# Patient Record
Sex: Male | Born: 1935 | Race: White | Hispanic: No | Marital: Married | Smoking: Former smoker
Health system: Southern US, Community
[De-identification: ages and names within clinical notes are randomized; demographics above are authoritative.]

## PROBLEM LIST (undated history)

## (undated) DIAGNOSIS — I4891 Unspecified atrial fibrillation: Secondary | ICD-10-CM

## (undated) DIAGNOSIS — I10 Essential (primary) hypertension: Secondary | ICD-10-CM

## (undated) DIAGNOSIS — M109 Gout, unspecified: Secondary | ICD-10-CM

## (undated) HISTORY — PX: TOTAL SHOULDER ARTHROPLASTY: SHX126

## (undated) HISTORY — DX: Essential (primary) hypertension: I10

## (undated) HISTORY — DX: Gout, unspecified: M10.9

## (undated) HISTORY — PX: APPENDECTOMY: SHX54

## (undated) HISTORY — DX: Unspecified atrial fibrillation: I48.91

---

## 2015-05-29 ENCOUNTER — Other Ambulatory Visit (HOSPITAL_COMMUNITY): Payer: Self-pay | Admitting: Orthopedic Surgery

## 2015-05-29 DIAGNOSIS — M25511 Pain in right shoulder: Secondary | ICD-10-CM

## 2015-05-31 ENCOUNTER — Other Ambulatory Visit: Payer: Self-pay | Admitting: Orthopedic Surgery

## 2015-05-31 DIAGNOSIS — M25511 Pain in right shoulder: Secondary | ICD-10-CM

## 2015-06-02 ENCOUNTER — Ambulatory Visit
Admission: RE | Admit: 2015-06-02 | Discharge: 2015-06-02 | Disposition: A | Discharge status: Home / Self Care | Payer: Medicare Other | Source: Ambulatory Visit | Attending: Orthopedic Surgery | Admitting: Orthopedic Surgery

## 2015-06-02 ENCOUNTER — Other Ambulatory Visit: Payer: Self-pay

## 2015-06-02 DIAGNOSIS — M25511 Pain in right shoulder: Secondary | ICD-10-CM

## 2016-08-23 NOTE — Progress Notes (Signed)
Office Visit Note  Patient: Billy Hurley             Date of Birth: 09-16-1935           MRN: [id number]             PCP: Olena Mater, MD Referring: Olena Mater, MD Visit Date: 08/26/2016 Occupation: Retired, Dance movement psychotherapist    Subjective:  Knee pain   History of Present Illness: Billy Hurley is a 81 y.o. male seen in consultation per request of his PCP. According to patient in July 2017 he noticed that he was having difficulty getting out of a low chair. The symptoms persisted until September 2017 when he went to see his PCP where he had x-rays and the x-ray showed only mild osteoarthritis in his knee joints. He was given a cortisone injection and has helped him which lasted for about 3 weeks. He went to follow-up with Dr. Lorre Nick were done surgery on her shoulder in the past. He states in December 2017 Dr. Lorre Nick gave him a cortisone injection to his right knee joint which lasted for short. He was having pain and discomfort in his bilateral knee joints at the time. He went for follow-up to see Dr. Lorre Nick in January at the time he did x-rays of his lumbar spine and and his knee joints. Dr. Lorre Nick did not advise any surgery. He recommended rheumatology evaluation. Patient recalls at this time he was having a stiffness in her shoulders and his hips and his knee joints is painful worse and he went to see his PCP. He was given prednisone Dosepak for 14 days which helped him a lot. Recalls that he could do most of his activities while on prednisone Dosepak. He was also given Voltaren gel topically to be used on his knees. After the prednisone dosepak finished his symptoms recurred. He is having difficulty with mobility inside the house. He has got a stair lift, a walker and a lift chair now. He was prescribed tramadol which does not help and causes constipation. He's been taking Tylenol 500 mg 2 tablets twice a day to relieve pain. He's been also sitting a lot due to discomfort. He is unable to lie on his  side as uses CPAP. He does not recall any joint swelling but he believes at one time he had some fluid on his left knee.   Activities of Daily Living:  Patient reports morning stiffness for all day hours.   Patient Reports nocturnal pain.  Difficulty dressing/grooming: Denies Difficulty climbing stairs: Reports Difficulty getting out of chair: Reports Difficulty using hands for taps, buttons, cutlery, and/or writing: Denies   Review of Systems  Constitutional: Negative for night sweats and weakness ( ).  HENT: Negative for mouth sores, mouth dryness and nose dryness.   Eyes: Negative for redness and dryness.  Respiratory: Negative for shortness of breath and difficulty breathing.   Cardiovascular: Negative for chest pain, palpitations, hypertension, irregular heartbeat and swelling in legs/feet.  Gastrointestinal: Negative for constipation and diarrhea.  Endocrine: Negative for increased urination.  Musculoskeletal: Positive for arthralgias, joint pain and morning stiffness. Negative for joint swelling, muscle weakness and muscle tenderness.  Skin: Negative for color change, rash, hair loss, nodules/bumps, skin tightness, ulcers and sensitivity to sunlight.  Allergic/Immunologic: Negative for susceptible to infections.  Neurological: Negative for dizziness, fainting, memory loss and night sweats.  Hematological: Negative for swollen glands.  Psychiatric/Behavioral: Positive for sleep disturbance. Negative for depressed mood. The patient is not nervous/anxious.  PMFS History:  Patient Active Problem List   Diagnosis Date Noted  . History of gout 08/26/2016  . Primary osteoarthritis of both knees 08/26/2016  . Impingement syndrome of shoulder region 08/26/2016  . Essential hypertension 08/26/2016  . History of hyperglycemia 08/26/2016  . Chronic atrial fibrillation ([place]) 08/26/2016  . Other sleep apnea 08/26/2016    Past Medical History:  Diagnosis Date  . A-fib ([place])   .  Gout   . Hypertension     Family History  Problem Relation Age of Onset  . Stroke Brother   . Hypertension Brother   . Hypertension Brother    Past Surgical History:  Procedure Laterality Date  . APPENDECTOMY    . TOTAL SHOULDER ARTHROPLASTY     Social History   Social History Narrative  . No narrative on file     Objective: Vital Signs: BP (!) 134/58 (BP Location: Left Arm, Patient Position: Sitting, Cuff Size: Normal)   Pulse 81   Resp 16   Ht '5\' 9"'  (1.753 m)   Wt 204 lb (92.5 kg)   BMI 30.13 kg/m    Physical Exam  Constitutional: He is oriented to person, place, and time. He appears well-developed and well-nourished.  HENT:  Head: Normocephalic and atraumatic.  Eyes: Conjunctivae and EOM are normal. Pupils are equal, round, and reactive to light.  Neck: Normal range of motion. Neck supple.  Cardiovascular: Normal rate, regular rhythm and normal heart sounds.   Pitting edema over bilateral lower extremities  Pulmonary/Chest: Effort normal and breath sounds normal.  Abdominal: Soft. Bowel sounds are normal.  Neurological: He is alert and oriented to person, place, and time.  Skin: Skin is warm and dry. Capillary refill takes less than 2 seconds.  Psychiatric: He has a normal mood and affect. His behavior is normal.  Nursing note and vitals reviewed.    Musculoskeletal Exam: C-spine limited range of motion, thoracic and lumbar spine good range of motion no SI joint tenderness. His right shoulder joint abduction was limited to 130 and left shoulder joint is limited to 90. He had no muscular tenderness around his shoulders. Elbow joints wrist joints are good range of motion he had DIP PIP thickening in bilateral hands consistent with osteoarthritis. He had tenderness over bilateral trochanteric bursa area hip joints with good range of motion. He had no muscular weakness in his bilateral lower extremities. He has warmth swelling and effusion in his bilateral knee joints.  He had pedal edema. No ankle joint warmth or swelling was noted. MTPs PIPs DIPs did not show any synovitis. CDAI Exam:  No CDAI exam completed.    Investigation: Findings:  07/09/2016 RF less than 14, ANA negative, uric acid 4.4, ESR 109, 06/24/2016 CMP alkaline phosphatase 145 elevated, CBC hemoglobin 11.4    Imaging: No results found.  Speciality Comments: No specialty comments available.    Procedures:  Large Joint Inj Date/Time: 08/26/2016 12:48 PM Performed by: Bo Merino Authorized by: Bo Merino   Consent Given by:  Patient Site marked: the procedure site was marked   Timeout: prior to procedure the correct patient, procedure, and site was verified   Indications:  Pain and joint swelling Location:  Knee Site:  L knee Prep: patient was prepped and draped in usual sterile fashion   Needle Size:  22 G Needle Length:  1.5 inches Approach:  Medial Ultrasound Guidance: No   Fluoroscopic Guidance: No   Arthrogram: No   Medications:  2 mL lidocaine 1 %; 60  mg triamcinolone acetonide 40 MG/ML Aspiration Attempted: Yes   Aspirate amount (mL):  7 Lab: fluid sent for laboratory analysis   Patient tolerance:  Patient tolerated the procedure well with no immediate complications    Allergies: Patient has no allergy information on record.   Assessment / Plan:     Visit Diagnoses: History of gout: Patient states that he has not had a flare of gout in a long time. And his uric acid done in December was within normal limits.  Inflammatory arthritis: He has bilateral knee joint effusion and elevated sedimentation rate. His rheumatoid factor was negative and uric acid was negative I will do further workup to evaluate this. In the meantime I'll put him on prednisone taper starting at 20 mg for one week, 15 mg for 2 weeks and then he will stay on 10 mg a day total follow-up. In the meantime he should have his lab results back.  Primary osteoarthritis of both knees -  X-rays 04/25/2016 mild OA, knee joint effusion, ESR 109. He has some severe pain and discomfort in his knee joints and difficulty with mobility.  Effusion of both knee joints: After informed consent was obtained he is left knee joint was aspirated and synovial surface sent for cell count crystals and culture. The procedure as described above.  Impingement syndrome of shoulder region, unspecified laterality - Right shoulder joint debridement November 2016 Dr. Lorre Nick  Essential hypertension  History of hyperglycemia  Chronic atrial fibrillation ([place])  Other sleep apnea - on CPAP    Orders: Orders Placed This Encounter  Procedures  . Large Joint Injection/Arthrocentesis  . Body fluid culture  . DG Chest 2 View  . Synovial cell count + diff, w/ crystals  . CBC with Differential/Platelet  . COMPLETE METABOLIC PANEL WITH GFR  . Urinalysis, Routine w reflex microscopic  . Cyclic citrul peptide antibody, IgG  . 14-3-3 eta Protein  . Serum protein electrophoresis with reflex  . IgG, IgA, IgM  . QuantiFERON(R)-TB Gold, Incubated  . HIV antibody  . Sedimentation rate  . HLA-B27 antigen  . Hepatitis B Surface AntiGEN  . Hepatitis B Core Antibody, IgM  . Hepatitis A antibody, IgM  . Hepatitis C Antibody  . Angiotensin converting enzyme   Meds ordered this encounter  Medications  . predniSONE (DELTASONE) 5 MG tablet    Sig: Take four tablets in am x1 week, then 3 tablets q am x2 weeks, then 2 tablets q am (continue this dose until rechecked)    Dispense:  120 tablet    Refill:  0    Face-to-face time spent with patient was 60 minutes. 50% of time was spent in counseling and coordination of care.  Follow-Up Instructions: Return for Inflammatory arthritis.   Bo Merino, MD  Note - This record has been created using Editor, commissioning.  Chart creation errors have been sought, but may not always  have been located. Such creation errors do not reflect on  the standard of  medical care.

## 2016-08-26 ENCOUNTER — Ambulatory Visit (INDEPENDENT_AMBULATORY_CARE_PROVIDER_SITE_OTHER): Payer: Medicare Other | Admitting: Rheumatology

## 2016-08-26 ENCOUNTER — Encounter: Payer: Self-pay | Admitting: Rheumatology

## 2016-08-26 ENCOUNTER — Ambulatory Visit (HOSPITAL_COMMUNITY)
Admission: RE | Admit: 2016-08-26 | Discharge: 2016-08-26 | Disposition: A | Discharge status: Home / Self Care | Payer: Medicare Other | Source: Ambulatory Visit | Attending: Rheumatology | Admitting: Rheumatology

## 2016-08-26 VITALS — BP 134/58 | HR 81 | Resp 16 | Ht 69.0 in | Wt 204.0 lb

## 2016-08-26 DIAGNOSIS — M25461 Effusion, right knee: Secondary | ICD-10-CM

## 2016-08-26 DIAGNOSIS — M25462 Effusion, left knee: Secondary | ICD-10-CM

## 2016-08-26 DIAGNOSIS — I482 Chronic atrial fibrillation, unspecified: Secondary | ICD-10-CM

## 2016-08-26 DIAGNOSIS — M170 Bilateral primary osteoarthritis of knee: Secondary | ICD-10-CM

## 2016-08-26 DIAGNOSIS — Z114 Encounter for screening for human immunodeficiency virus [HIV]: Secondary | ICD-10-CM

## 2016-08-26 DIAGNOSIS — Z111 Encounter for screening for respiratory tuberculosis: Secondary | ICD-10-CM

## 2016-08-26 DIAGNOSIS — Z1159 Encounter for screening for other viral diseases: Secondary | ICD-10-CM

## 2016-08-26 DIAGNOSIS — M7540 Impingement syndrome of unspecified shoulder: Secondary | ICD-10-CM | POA: Diagnosis not present

## 2016-08-26 DIAGNOSIS — M2550 Pain in unspecified joint: Secondary | ICD-10-CM

## 2016-08-26 DIAGNOSIS — I517 Cardiomegaly: Secondary | ICD-10-CM | POA: Insufficient documentation

## 2016-08-26 DIAGNOSIS — J90 Pleural effusion, not elsewhere classified: Secondary | ICD-10-CM | POA: Diagnosis not present

## 2016-08-26 DIAGNOSIS — Z8639 Personal history of other endocrine, nutritional and metabolic disease: Secondary | ICD-10-CM | POA: Diagnosis not present

## 2016-08-26 DIAGNOSIS — I10 Essential (primary) hypertension: Secondary | ICD-10-CM | POA: Diagnosis not present

## 2016-08-26 DIAGNOSIS — G4739 Other sleep apnea: Secondary | ICD-10-CM

## 2016-08-26 DIAGNOSIS — Z8739 Personal history of other diseases of the musculoskeletal system and connective tissue: Secondary | ICD-10-CM

## 2016-08-26 LAB — COMPLETE METABOLIC PANEL WITH GFR
ALT: 13 U/L (ref 9–46)
AST: 18 U/L (ref 10–35)
Albumin: 3.6 g/dL (ref 3.6–5.1)
Alkaline Phosphatase: 148 U/L — ABNORMAL HIGH (ref 40–115)
BUN: 14 mg/dL (ref 7–25)
CALCIUM: 9.1 mg/dL (ref 8.6–10.3)
CHLORIDE: 98 mmol/L (ref 98–110)
CO2: 22 mmol/L (ref 20–31)
Creat: 0.56 mg/dL — ABNORMAL LOW (ref 0.70–1.18)
GFR, Est Non African American: >89 mL/min (ref >=60)
Glucose, Bld: 91 mg/dL (ref 65–99)
Potassium: 4.3 mmol/L (ref 3.5–5.3)
Sodium: 133 mmol/L — ABNORMAL LOW (ref 135–146)
Total Bilirubin: 0.7 mg/dL (ref 0.2–1.2)
Total Protein: 7.2 g/dL (ref 6.1–8.1)

## 2016-08-26 LAB — CBC WITH DIFFERENTIAL/PLATELET
Basophils Absolute: 0 cells/uL (ref 0–200)
Basophils Relative: 0 %
Eosinophils Absolute: 118 cells/uL (ref 15–500)
Eosinophils Relative: 2 %
HEMATOCRIT: 32.9 % — AB (ref 38.5–50.0)
Hemoglobin: 10.8 g/dL — ABNORMAL LOW (ref 13.2–17.1)
LYMPHS PCT: 25 %
Lymphs Abs: 1475 cells/uL (ref 850–3900)
MCH: 28.8 pg (ref 27.0–33.0)
MCHC: 32.8 g/dL (ref 32.0–36.0)
MCV: 87.7 fL (ref 80.0–100.0)
MONO ABS: 708 {cells}/uL (ref 200–950)
MONOS PCT: 12 %
MPV: 9.2 fL (ref 7.5–12.5)
NEUTROS PCT: 61 %
Neutro Abs: 3599 cells/uL (ref 1500–7800)
PLATELETS: 362 10*3/uL (ref 140–400)
RBC: 3.75 MIL/uL — AB (ref 4.20–5.80)
RDW: 16 % — AB (ref 11.0–15.0)
WBC: 5.9 10*3/uL (ref 3.8–10.8)

## 2016-08-26 MED ORDER — PREDNISONE 5 MG PO TABS: ORAL_TABLET | ORAL | 0 refills | Status: DC

## 2016-08-26 MED ORDER — TRIAMCINOLONE ACETONIDE 40 MG/ML IJ SUSP
60.0000 mg | INTRAMUSCULAR | Status: AC | PRN
  Administered 2016-08-26: 60 mg via INTRA_ARTICULAR

## 2016-08-26 MED ORDER — LIDOCAINE HCL 1 % IJ SOLN
2.0000 mL | INTRAMUSCULAR | Status: AC | PRN
  Administered 2016-08-26: 2 mL

## 2016-08-27 LAB — SYNOVIAL CELL COUNT + DIFF, W/ CRYSTALS
BASOPHILS, %: 0 %
EOSINOPHILS-SYNOVIAL: 0 % (ref 0–2)
Lymphocytes-Synovial Fld: 52 % (ref 0–74)
Monocyte/Macrophage: 28 % (ref 0–69)
NEUTROPHIL, SYNOVIAL: 20 % (ref 0–24)
SYNOVIOCYTES, %: 0 % (ref 0–15)
WBC, Synovial: 10960 cells/uL — ABNORMAL HIGH (ref <150)

## 2016-08-27 LAB — URINALYSIS, ROUTINE W REFLEX MICROSCOPIC
Bilirubin Urine: NEGATIVE
Glucose, UA: NEGATIVE
Hgb urine dipstick: NEGATIVE
Ketones, ur: NEGATIVE
Leukocytes, UA: NEGATIVE
Nitrite: NEGATIVE
Protein, ur: NEGATIVE
SPECIFIC GRAVITY, URINE: 1.018 (ref 1.001–1.035)
pH: 7 (ref 5.0–8.0)

## 2016-08-27 LAB — HEPATITIS C ANTIBODY: HCV AB: NEGATIVE

## 2016-08-27 LAB — ANGIOTENSIN CONVERTING ENZYME: Angiotensin-Converting Enzyme: 83 U/L — ABNORMAL HIGH (ref 9–67)

## 2016-08-27 LAB — IGG, IGA, IGM
IgA: 341 mg/dL (ref 81–463)
IgG (Immunoglobin G), Serum: 1293 mg/dL (ref 694–1618)
IgM, Serum: 86 mg/dL (ref 48–271)

## 2016-08-27 LAB — HIV ANTIBODY (ROUTINE TESTING W REFLEX): HIV: NONREACTIVE

## 2016-08-27 LAB — HEPATITIS A ANTIBODY, IGM: Hep A IgM: NONREACTIVE

## 2016-08-27 LAB — HEPATITIS B CORE ANTIBODY, IGM: HEP B C IGM: NONREACTIVE

## 2016-08-27 LAB — SEDIMENTATION RATE: SED RATE: 50 mm/h — AB (ref 0–20)

## 2016-08-27 LAB — HEPATITIS B SURFACE ANTIGEN: HEP B S AG: NEGATIVE

## 2016-08-27 LAB — CYCLIC CITRUL PEPTIDE ANTIBODY, IGG: CYCLIC CITRULLIN PEPTIDE AB: 28 U — AB

## 2016-08-27 NOTE — Progress Notes (Signed)
ACE elevated, please get chest x-ray PA and lateral if she has not had one

## 2016-08-28 LAB — PROTEIN ELECTROPHORESIS, SERUM, WITH REFLEX
ALPHA-2-GLOBULIN: 1 g/dL — AB (ref 0.5–0.9)
Albumin ELP: 3.3 g/dL — ABNORMAL LOW (ref 3.8–4.8)
Alpha-1-Globulin: 0.6 g/dL — ABNORMAL HIGH (ref 0.2–0.3)
BETA GLOBULIN: 0.5 g/dL (ref 0.4–0.6)
Beta 2: 0.6 g/dL — ABNORMAL HIGH (ref 0.2–0.5)
GAMMA GLOBULIN: 1.2 g/dL (ref 0.8–1.7)
TOTAL PROTEIN, SERUM ELECTROPHOR: 7.2 g/dL (ref 6.1–8.1)

## 2016-08-29 LAB — QUANTIFERON(R)-TB GOLD, INCUBATED
MITOGEN-NIL SO: 0.22 [IU]/mL
QUANTIFERON NIL VALUE: 0.02 [IU]/mL
Quantiferon Tb Ag Minus Nil Value: 0 IU/mL

## 2016-08-29 LAB — IFE INTERPRETATION

## 2016-08-30 LAB — BODY FLUID CULTURE
Gram Stain: NONE SEEN
ORGANISM ID, BACTERIA: NO GROWTH

## 2016-08-30 LAB — 14-3-3 ETA PROTEIN

## 2016-09-02 LAB — HLA-B27 ANTIGEN: DNA Result:: NEGATIVE

## 2016-09-17 DIAGNOSIS — M25462 Effusion, left knee: Secondary | ICD-10-CM

## 2016-09-17 DIAGNOSIS — M1A09X0 Idiopathic chronic gout, multiple sites, without tophus (tophi): Secondary | ICD-10-CM | POA: Insufficient documentation

## 2016-09-17 DIAGNOSIS — M25461 Effusion, right knee: Secondary | ICD-10-CM | POA: Insufficient documentation

## 2016-09-17 NOTE — Progress Notes (Addendum)
Office Visit Note  Patient: Billy Hurley             Date of Birth: August 10, 1935           MRN: [id number]             PCP: Olena Mater, MD Referring: No ref. provider found Visit Date: 09/29/2016 Occupation: '@GUAROCC' @    Subjective:  Pain knees   History of Present Illness: Billy Hurley is a 81 y.o. male with history of seronegative inflammatory arthritis most likely rheumatoid arthritis. He was a started on prednisone 20 mg by mouth daily asked visit. He's been tapering his prednisone gradually and currently taking 10 mg by mouth daily. He has noticed remarkable improvement in his joint symptoms since she's been on prednisone. He is able to move his shoulders without any difficulty now. Left knee joint effusion has resolved. Even the swelling on his ankles have gone down.  Activities of Daily Living:  Patient reports morning stiffness for minute.   Patient Denies nocturnal pain.  Difficulty dressing/grooming: Denies Difficulty climbing stairs: Denies Difficulty getting out of chair: Denies Difficulty using hands for taps, buttons, cutlery, and/or writing: Denies   Review of Systems  Constitutional: Positive for fatigue. Negative for night sweats and weakness ( ).  HENT: Negative for mouth sores, mouth dryness and nose dryness.   Eyes: Negative for redness and dryness.  Respiratory: Negative for shortness of breath and difficulty breathing.   Cardiovascular: Negative for chest pain, palpitations, hypertension, irregular heartbeat and swelling in legs/feet.  Gastrointestinal: Negative for constipation and diarrhea.  Endocrine: Negative for increased urination.  Musculoskeletal: Positive for arthralgias, joint pain and morning stiffness. Negative for joint swelling, myalgias, muscle weakness, muscle tenderness and myalgias.  Skin: Negative for color change, rash, hair loss, nodules/bumps, skin tightness, ulcers and sensitivity to sunlight.  Allergic/Immunologic: Negative for  susceptible to infections.  Neurological: Negative for dizziness, fainting, memory loss and night sweats.  Hematological: Negative for swollen glands.  Psychiatric/Behavioral: Negative for depressed mood and sleep disturbance. The patient is not nervous/anxious.     PMFS History:  Patient Active Problem List   Diagnosis Date Noted  . Idiopathic chronic gout of multiple sites without tophus 09/17/2016  . Bilateral knee effusions 09/17/2016  . History of gout 08/26/2016  . Primary osteoarthritis of both knees 08/26/2016  . Impingement syndrome of shoulder region 08/26/2016  . Essential hypertension 08/26/2016  . History of hyperglycemia 08/26/2016  . Chronic atrial fibrillation ([place]) 08/26/2016  . Other sleep apnea 08/26/2016    Past Medical History:  Diagnosis Date  . A-fib ([place])   . Gout   . Hypertension     Family History  Problem Relation Age of Onset  . Stroke Brother   . Hypertension Brother   . Hypertension Brother    Past Surgical History:  Procedure Laterality Date  . APPENDECTOMY    . TOTAL SHOULDER ARTHROPLASTY     Social History   Social History Narrative  . No narrative on file     Objective: Vital Signs: BP 138/78   Pulse 78   Resp 14   Ht '5\' 9"'  (1.753 m)   Wt 195 lb (88.5 kg)   BMI 28.80 kg/m    Physical Exam  Constitutional: He is oriented to person, place, and time. He appears well-developed and well-nourished.  HENT:  Head: Normocephalic and atraumatic.  Eyes: Conjunctivae and EOM are normal. Pupils are equal, round, and reactive to light.  Neck: Normal range of motion. Neck  supple.  Cardiovascular: Normal rate and normal heart sounds.   History of atrial fibrillation  Pulmonary/Chest: Effort normal and breath sounds normal.  Abdominal: Soft. Bowel sounds are normal.  Neurological: He is alert and oriented to person, place, and time.  Skin: Skin is warm and dry. Capillary refill takes less than 2 seconds.  Psychiatric: He has a normal  mood and affect. His behavior is normal.  Nursing note and vitals reviewed.    Musculoskeletal Exam: C-spine good range of motion. Right shoulder joint abduction limited to her and 40 the left shoulder joint limited to her 120. Elbow joints wrist joint MCPs PIPs DIPs with good range of motion with no swelling. He had warmth and swelling in his right knee joint with effusion there was a small effusion in his left knee joint. Swelling or ankle joints and MTPs is completely resolved.  CDAI Exam: CDAI Homunculus Exam:   Swelling:  RLE: tibiofemoral LLE: tibiofemoral  Joint Counts:  CDAI Tender Joint count: 0 CDAI Swollen Joint count: 2  Global Assessments:  Patient Global Assessment: 2 Provider Global Assessment: 2  CDAI Calculated Score: 6    Investigation: No additional findings.  Office Visit on 08/26/2016  Component Date Value Ref Range Status  . Site 08/26/2016 LEFT KNEE   Final  . Color, Synovial 08/26/2016 LIGHT YELLOW  STRAW/YELLOW Final  . Appearance-Synovial 08/26/2016 HAZY  CLEAR/HAZY Final  . WBC, Synovial 08/26/2016 10960* <150 cells/uL Final  . Neutrophil, Synovial 08/26/2016 20  0 - 24 % Final  . Lymphocytes-Synovial Fld 08/26/2016 52  0 - 74 % Final  . Monocyte/Macrophage 08/26/2016 28  0 - 69 % Final  . Eosinophils-Synovial 08/26/2016 0  0 - 2 % Final  . Basophils, % 08/26/2016 0  0 % Final  . Synoviocytes, % 08/26/2016 0  0 - 15 % Final  . Crystals 08/26/2016 SEE NOTE  NONE SEEN HPF Final  . Gram Stain 08/26/2016 Moderate   Final  . Gram Stain 08/26/2016 WBC present-both PMN and Mononuclear   Final  . Gram Stain 08/26/2016 No Organisms Seen   Final  . Organism ID, Bacteria 08/26/2016 NO GROWTH 3 DAYS   Final  . WBC 08/26/2016 5.9  3.8 - 10.8 K/uL Final  . RBC 08/26/2016 3.75* 4.20 - 5.80 MIL/uL Final  . Hemoglobin 08/26/2016 10.8* 13.2 - 17.1 g/dL Final  . HCT 08/26/2016 32.9* 38.5 - 50.0 % Final  . MCV 08/26/2016 87.7  80.0 - 100.0 fL Final  . MCH  08/26/2016 28.8  27.0 - 33.0 pg Final  . MCHC 08/26/2016 32.8  32.0 - 36.0 g/dL Final  . RDW 08/26/2016 16.0* 11.0 - 15.0 % Final  . Platelets 08/26/2016 362  140 - 400 K/uL Final  . MPV 08/26/2016 9.2  7.5 - 12.5 fL Final  . Neutro Abs 08/26/2016 3599  1,500 - 7,800 cells/uL Final  . Lymphs Abs 08/26/2016 1475  850 - 3,900 cells/uL Final  . Monocytes Absolute 08/26/2016 708  200 - 950 cells/uL Final  . Eosinophils Absolute 08/26/2016 118  15 - 500 cells/uL Final  . Basophils Absolute 08/26/2016 0  0 - 200 cells/uL Final  . Neutrophils Relative % 08/26/2016 61  % Final  . Lymphocytes Relative 08/26/2016 25  % Final  . Monocytes Relative 08/26/2016 12  % Final  . Eosinophils Relative 08/26/2016 2  % Final  . Basophils Relative 08/26/2016 0  % Final  . Smear Review 08/26/2016 Criteria for review not met   Final  .  Sodium 08/26/2016 133* 135 - 146 mmol/L Final  . Potassium 08/26/2016 4.3  3.5 - 5.3 mmol/L Final  . Chloride 08/26/2016 98  98 - 110 mmol/L Final  . CO2 08/26/2016 22  20 - 31 mmol/L Final  . Glucose, Bld 08/26/2016 91  65 - 99 mg/dL Final  . BUN 08/26/2016 14  7 - 25 mg/dL Final  . Creat 08/26/2016 0.56* 0.70 - 1.18 mg/dL Final   Comment:   For patients > or = 80 years of age: The upper reference limit for Creatinine is approximately 13% higher for people identified as African-American.     . Total Bilirubin 08/26/2016 0.7  0.2 - 1.2 mg/dL Final  . Alkaline Phosphatase 08/26/2016 148* 40 - 115 U/L Final  . AST 08/26/2016 18  10 - 35 U/L Final  . ALT 08/26/2016 13  9 - 46 U/L Final  . Total Protein 08/26/2016 7.2  6.1 - 8.1 g/dL Final  . Albumin 08/26/2016 3.6  3.6 - 5.1 g/dL Final  . Calcium 08/26/2016 9.1  8.6 - 10.3 mg/dL Final  . GFR, Est African American 08/26/2016 >89  >=60 mL/min Final  . GFR, Est Non African American 08/26/2016 >89  >=60 mL/min Final  . Color, Urine 08/26/2016 YELLOW  YELLOW Final  . APPearance 08/26/2016 CLEAR  CLEAR Final  . Specific  Gravity, Urine 08/26/2016 1.018  1.001 - 1.035 Final  . pH 08/26/2016 7.0  5.0 - 8.0 Final  . Glucose, UA 08/26/2016 NEGATIVE  NEGATIVE Final  . Bilirubin Urine 08/26/2016 NEGATIVE  NEGATIVE Final  . Ketones, ur 08/26/2016 NEGATIVE  NEGATIVE Final  . Hgb urine dipstick 08/26/2016 NEGATIVE  NEGATIVE Final  . Protein, ur 08/26/2016 NEGATIVE  NEGATIVE Final  . Nitrite 08/26/2016 NEGATIVE  NEGATIVE Final  . Leukocytes, UA 08/26/2016 NEGATIVE  NEGATIVE Final  . Cyclic Citrullin Peptide Ab 08/26/2016 28* Units Final   Comment:   Reference Range Negative               < 20 Weak Positive            20 - 39 Moderate Positive        40 - 59 Strong Positive        > 59   . 14-3-3 eta Protein 08/26/2016 <0.2  <0.2 ng/mL Final   Comment:   This test was developed and its analytical performance characteristics have been determined by [facility]. It has not been cleared or approved by FDA. This assay has been validated pursuant to the CLIA regulations and is used for clinical purposes.   . Total Protein, Serum Electrophores* 08/26/2016 7.2  6.1 - 8.1 g/dL Final  . Albumin ELP 08/26/2016 3.3* 3.8 - 4.8 g/dL Final  . Alpha-1-Globulin 08/26/2016 0.6* 0.2 - 0.3 g/dL Final  . Alpha-2-Globulin 08/26/2016 1.0* 0.5 - 0.9 g/dL Final  . Beta Globulin 08/26/2016 0.5  0.4 - 0.6 g/dL Final  . Beta 2 08/26/2016 0.6* 0.2 - 0.5 g/dL Final  . Gamma Globulin 08/26/2016 1.2  0.8 - 1.7 g/dL Final  . Abnormal Protein Band1 08/26/2016 NOT DET  g/dL Final  . SPE Interp. 08/26/2016 SEE NOTE   Final   Comment: A poorly defined band of restricted protein mobility is detected in the beta globulins. It is unlikely this may represent a monoclonal protein, however, immunofixation analysis is available if clinically indicated. Reflex testing will be performed as indicated. Reviewed by Odis Hollingshead, MD, PhD, FCAP (Electronic  Signature on File)   . Abnormal Protein Band2  08/26/2016 NOT DET  g/dL Final  . Abnormal Protein Band3 08/26/2016 NOT DET  g/dL Final  . IgG (Immunoglobin G), Serum 08/26/2016 1293  694 - 1,618 mg/dL Final  . IgA 08/26/2016 341  81 - 463 mg/dL Final  . IgM, Serum 08/26/2016 86  48 - 271 mg/dL Final  . Interferon Gamma Release Assay 08/26/2016 INDETERM* NEGATIVE Final   Comment: Results are indeterminate for response to ESAT-6,TB7.7 and/or CFP-10 test antigens.   . Quantiferon Nil Value 08/26/2016 0.02  IU/mL Final  . Mitogen-Nil 08/26/2016 0.22  IU/mL Final  . Quantiferon Tb Ag Minus Nil Value 08/26/2016 0.00  IU/mL Final   Comment:   The Nil tube value is used to determine if the patient has a preexisting immune response which could cause a false-positive reading on the test. In order for a test to be valid, the Nil tube must have a value of less than or equal to 8.0 IU/mL.   The mitogen control tube is used to assure the patient has a healthy immune status and also serves as a control for correct blood handling and incubation. It is used to detect false-negative readings. The mitogen tube must have a gamma interferon value of greater than or equal to 0.5 IU/mL higher than the value of the Nil tube.   The TB antigen tube is coated with the M. tuberculosis specific antigens. For a test to be considered positive, the TB antigen tube value minus the Nil tube value must be greater than or equal to 0.35 IU/mL.   For additional information, please refer to http://education.quest[facility].com/faq/QFT (This link is being provided for informational/educational purposes only.)   . HIV 1&2 Ab, 4th Generation 08/26/2016 NONREACTIVE  NONREACTIVE Final   Comment:   HIV-1 antigen and HIV-1/HIV-2 antibodies were not detected.  There is no laboratory evidence of HIV infection.   HIV-1/2 Antibody Diff        Not indicated. HIV-1 RNA, Qual TMA          Not indicated.     PLEASE NOTE: This information has been disclosed to you from  records whose confidentiality may be protected by state law. If your state requires such protection, then the state law prohibits you from making any further disclosure of the information without the specific written consent of the person to whom it pertains, or as otherwise permitted by law. A general authorization for the release of medical or other information is NOT sufficient for this purpose.   The performance of this assay has not been clinically validated in patients less than 82 years old.   For additional information please refer to http://education.quest[facility].com/faq/FAQ106.  (This link is being provided for informational/educational purposes only.)     . Sed Rate 08/26/2016 50* 0 - 20 mm/hr Final  . DNA Result: 08/26/2016 Negative  Negative Final  . Results reviewed by: 08/26/2016 REPORT   Final   Comment: Ileene Hutchinson, Ph.D.,F[facility] Senior Director, Molecular Genetics The B27 allele group of the HLA-B locus is present in 2 to 9% of the general population.  About 20% of HLA-B27 carriers develop autoimmune disorders including Ankylosing Spondylitis (AS), Reactive Arthritis, Psoriatic Arthritis, Undifferentiated Oligoarthritis, Uveitis, or Inflammatory Bowel Disease. The highest association is with AS, where approximately 95% of AS patients are HLA-B27 positive. Genetic counseling as needed. Typing performed by PCR and hybridization with sequence specific oligonucleotide probes (SSO) using the FDA-cleared LABType(R) SSO Kit.   . Hepatitis  B Surface Ag 08/26/2016 NEGATIVE  NEGATIVE Final  . Hep B C IgM 08/26/2016 NON REACTIVE  NON REACTIVE Final   Comment: High levels of Hepatitis B Core IgM antibody are detectable during the acute stage of Hepatitis B. This antibody is used to differentiate current from past HBV infection.     . Hep A IgM 08/26/2016 NON REACTIVE  NON REACTIVE Final  . HCV Ab 08/26/2016 NEGATIVE  NEGATIVE Final  . Angiotensin-Converting  Enzyme 08/26/2016 83* 9 - 67 U/L Final   Comment: ** Please note change in reference range(s). **     . Immunofix Electr Int 08/26/2016 SEE NOTE   Final   Comment: Normal pattern. No monoclonal proteins detected. Reviewed by Odis Hollingshead, MD, PhD, FCAP (Electronic Signature on File)     Imaging: No results found.  Speciality Comments: No specialty comments available.    Procedures:  Large Joint Inj Date/Time: 09/29/2016 12:37 PM Performed by: Bo Merino Authorized by: Bo Merino   Consent Given by:  Patient Site marked: the procedure site was marked   Timeout: prior to procedure the correct patient, procedure, and site was verified   Indications:  Pain and joint swelling Location:  Knee Site:  R knee Prep: patient was prepped and draped in usual sterile fashion   Needle Size:  27 G Needle Length:  1.5 inches Approach:  Medial Ultrasound Guidance: No   Fluoroscopic Guidance: No   Arthrogram: No   Medications:  1.5 mL lidocaine 1 %; 40 mg triamcinolone acetonide 40 MG/ML Aspiration Attempted: Yes   Aspirate amount (mL):  10.5 Lab: fluid sent for laboratory analysis   Patient tolerance:  Patient tolerated the procedure well with no immediate complications    Allergies: Patient has no known allergies.   Assessment / Plan:     Visit Diagnoses: Rheumatoid arthritis of multiple sites with negative rheumatoid factor (HCC) - Negative RF, positive CCP, elevated ESR. He has warmth swelling and effusion in his bilateral knee joints. He had multiple joint involvement last visit which has improved on prednisone. It is still on prednisone 10 mg a day and feeling much better. The plan is to continue to taper off prednisone as discussed. I also discussed the use of methotrexate indications side effects contraindications were discussed at length. My plan is to start him on 4 tablets by mouth every week along with folic acid 1 mg by mouth daily. We'll check lab work in  2 weeks then every 2 months. If need be the methotrexate dose can be increased further I'm hesitant to increase the dose to much due to his age and multiple medications he is taking. He will need TB gold today as his TB gold was indeterminate. Prior to starting his methotrexate.  Idiopathic chronic gout of multiple sites without tophus  Primary osteoarthritis of both knees: Chronic pain  Bilateral knee effusions -he still has some residual effusion although it's improved. His right knee had moderate effusion per his request after informed consent was obtained and it was also aspirated the procedures described above he tolerated the procedure well. RF negative, CCP 18, HLA-B27 negative, ESR 109 04/25/2016  Impingement syndrome of shoulder region, unspecified laterality: He has limited range of motion of his bilateral shoulders.  His other medical problems are listed as follows:  History of hyperglycemia  History of sleep apnea  History of hypertension  Chronic atrial fibrillation ([place])    Orders: No orders of the defined types were placed in this encounter.  No orders of the defined types were placed in this encounter.   Face-to-face time spent with patient was 40 minutes. 50% of time was spent in counseling and coordination of care.  Follow-Up Instructions: Return in about 2 months (around 11/29/2016) for Rheumatoid arthritis.   Bo Merino, MD  Note - This record has been created using Editor, commissioning.  Chart creation errors have been sought, but may not always  have been located. Such creation errors do not reflect on  the standard of medical care.

## 2016-09-29 ENCOUNTER — Ambulatory Visit (INDEPENDENT_AMBULATORY_CARE_PROVIDER_SITE_OTHER): Payer: Medicare Other | Admitting: Rheumatology

## 2016-09-29 ENCOUNTER — Encounter: Payer: Self-pay | Admitting: Rheumatology

## 2016-09-29 VITALS — BP 138/78 | HR 78 | Resp 14 | Ht 69.0 in | Wt 195.0 lb

## 2016-09-29 DIAGNOSIS — Z8639 Personal history of other endocrine, nutritional and metabolic disease: Secondary | ICD-10-CM

## 2016-09-29 DIAGNOSIS — M1A09X0 Idiopathic chronic gout, multiple sites, without tophus (tophi): Secondary | ICD-10-CM

## 2016-09-29 DIAGNOSIS — Z8679 Personal history of other diseases of the circulatory system: Secondary | ICD-10-CM

## 2016-09-29 DIAGNOSIS — Z79899 Other long term (current) drug therapy: Secondary | ICD-10-CM

## 2016-09-29 DIAGNOSIS — M7540 Impingement syndrome of unspecified shoulder: Secondary | ICD-10-CM

## 2016-09-29 DIAGNOSIS — I482 Chronic atrial fibrillation, unspecified: Secondary | ICD-10-CM

## 2016-09-29 DIAGNOSIS — M25462 Effusion, left knee: Secondary | ICD-10-CM

## 2016-09-29 DIAGNOSIS — M25461 Effusion, right knee: Secondary | ICD-10-CM

## 2016-09-29 DIAGNOSIS — Z8669 Personal history of other diseases of the nervous system and sense organs: Secondary | ICD-10-CM

## 2016-09-29 DIAGNOSIS — M170 Bilateral primary osteoarthritis of knee: Secondary | ICD-10-CM

## 2016-09-29 DIAGNOSIS — M0609 Rheumatoid arthritis without rheumatoid factor, multiple sites: Secondary | ICD-10-CM

## 2016-09-29 LAB — SYNOVIAL CELL COUNT + DIFF, W/ CRYSTALS
BASOPHILS, %: 0 %
Eosinophils-Synovial: 0 % (ref 0–2)
LYMPHOCYTES-SYNOVIAL FLD: 56 % (ref 0–74)
MONOCYTE/MACROPHAGE: 11 % (ref 0–69)
NEUTROPHIL, SYNOVIAL: 33 % — AB (ref 0–24)
Synoviocytes, %: 0 % (ref 0–15)
WBC, Synovial: 9440 cells/uL — ABNORMAL HIGH (ref <150)

## 2016-09-29 MED ORDER — LIDOCAINE HCL 1 % IJ SOLN
1.5000 mL | INTRAMUSCULAR | Status: AC | PRN
  Administered 2016-09-29: 1.5 mL

## 2016-09-29 MED ORDER — TRIAMCINOLONE ACETONIDE 40 MG/ML IJ SUSP
40.0000 mg | INTRAMUSCULAR | Status: AC | PRN
  Administered 2016-09-29: 40 mg via INTRA_ARTICULAR

## 2016-09-29 NOTE — Progress Notes (Addendum)
Pharmacy Note  Subjective: Patient presents today to the [facility] to see Dr. Corliss Skainseveshwar.  Patient seen by the pharmacist for counseling on methotrexate.    Objective: CBC    Component Value Date/Time   WBC 5.9 08/26/2016 1150   RBC 3.75 (L) 08/26/2016 1150   HGB 10.8 (L) 08/26/2016 1150   HCT 32.9 (L) 08/26/2016 1150   PLT 362 08/26/2016 1150   MCV 87.7 08/26/2016 1150   MCH 28.8 08/26/2016 1150   MCHC 32.8 08/26/2016 1150   RDW 16.0 (H) 08/26/2016 1150   LYMPHSABS 1,475 08/26/2016 1150   MONOABS 708 08/26/2016 1150   EOSABS 118 08/26/2016 1150   BASOSABS 0 08/26/2016 1150   CMP     Component Value Date/Time   NA 133 (L) 08/26/2016 1150   K 4.3 08/26/2016 1150   CL 98 08/26/2016 1150   CO2 22 08/26/2016 1150   GLUCOSE 91 08/26/2016 1150   BUN 14 08/26/2016 1150   CREATININE 0.56 (L) 08/26/2016 1150   CALCIUM 9.1 08/26/2016 1150   PROT 7.2 08/26/2016 1150   ALBUMIN 3.6 08/26/2016 1150   AST 18 08/26/2016 1150   ALT 13 08/26/2016 1150   ALKPHOS 148 (H) 08/26/2016 1150   BILITOT 0.7 08/26/2016 1150   GFRNONAA >89 08/26/2016 1150   GFRAA >89 08/26/2016 1150   TB Gold: 08/26/16 Indeterminant - will repeat today Hepatitis panel: negative (08/26/16) HIV: negative (08/26/16)  Chest-xray:  08/26/2016  Contraception: patient's wife is post-menopausal  Assessment/Plan:  Patient was counseled on the purpose, proper use, and adverse effects of methotrexate including nausea, infection, and signs and symptoms of pneumonitis.  Reviewed instructions with patient to take methotrexate weekly along with folic acid daily.  Discussed the importance of frequent monitoring of kidney and liver function and blood counts, and provided patient with standing lab instructions.  Counseled patient to avoid NSAIDs and alcohol while on methotrexate.  Also noted that patient is on warfarin.  Reviewed with patient that methotrexate may increase warfarin response (ie, INR, signs and  symptoms of bleeding).  Advised patient to inform the clinic where his warfarin is monitored when he starts methotrexate or any new medication to ensure close monitoring of his INR.  Provided patient with educational materials on methotrexate and answered all questions.  Advised patient to get annual influenza vaccine and to get a pneumococcal vaccine if patient has not already had one.  Patient voiced understanding.  Patient consented to methotrexate use.  Will upload into chart.     Lilla Shookachel Joshuajames Moehring, Pharm.D., BCPS Clinical Pharmacist Pager: [phone] Phone: [phone] 09/29/2016 12:17 PM

## 2016-09-29 NOTE — Patient Instructions (Addendum)
Standing Labs We placed an order today for your standing lab work.    Please come back and get your standing labs two weeks after starting methtorexate then every 2 months  We have open lab Monday through Friday from 8:30-11:30 AM and 1:30-4 PM at the office of Dr. Arbutus Ped, PA.   The office is located at [address], [address], [place], [place] [address] No appointment is necessary.   Labs are drawn by [facility].  You may receive a bill from [facility] for your lab work.      Methotrexate tablets What is this medicine? METHOTREXATE (METH oh TREX ate) is a chemotherapy drug used to treat cancer including breast cancer, leukemia, and lymphoma. This medicine can also be used to treat psoriasis and certain kinds of arthritis. This medicine may be used for other purposes; ask your health care provider or pharmacist if you have questions. COMMON BRAND NAME(S): Rheumatrex, Trexall What should I tell my health care provider before I take this medicine? They need to know if you have any of these conditions: -fluid in the stomach area or lungs -if you often drink alcohol -infection or immune system problems -kidney disease or on hemodialysis -liver disease -low blood counts, like low white cell, platelet, or red cell counts -lung disease -radiation therapy -stomach ulcers -ulcerative colitis -an unusual or allergic reaction to methotrexate, other medicines, foods, dyes, or preservatives -pregnant or trying to get pregnant -breast-feeding How should I use this medicine? Take this medicine by mouth with a glass of water. Follow the directions on the prescription label. Take your medicine at regular intervals. Do not take it more often than directed. Do not stop taking except on your doctor's advice. Make sure you know why you are taking this medicine and how often you should take it. If this medicine is used for a condition that is not cancer, like arthritis or psoriasis,  it should be taken weekly, NOT daily. Taking this medicine more often than directed can cause serious side effects, even death. Talk to your healthcare provider about safe handling and disposal of this medicine. You may need to take special precautions. Talk to your pediatrician regarding the use of this medicine in children. While this drug may be prescribed for selected conditions, precautions do apply. Overdosage: If you think you have taken too much of this medicine contact a poison control center or emergency room at once. NOTE: This medicine is only for you. Do not share this medicine with others. What if I miss a dose? If you miss a dose, talk with your doctor or health care professional. Do not take double or extra doses. What may interact with this medicine? This medicine may interact with the following medication: -acitretin -aspirin and aspirin-like medicines including salicylates -azathioprine -certain antibiotics like penicillins, tetracycline, and chloramphenicol -cyclosporine -gold -hydroxychloroquine -live virus vaccines -NSAIDs, medicines for pain and inflammation, like ibuprofen or naproxen -other cytotoxic agents -penicillamine -phenylbutazone -phenytoin -probenecid -retinoids such as isotretinoin and tretinoin -steroid medicines like prednisone or cortisone -sulfonamides like sulfasalazine and trimethoprim/sulfamethoxazole -theophylline This list may not describe all possible interactions. Give your health care provider a list of all the medicines, herbs, non-prescription drugs, or dietary supplements you use. Also tell them if you smoke, drink alcohol, or use illegal drugs. Some items may interact with your medicine. What should I watch for while using this medicine? Avoid alcoholic drinks. This medicine can make you more sensitive to the sun. Keep out of the sun. If  you cannot avoid being in the sun, wear protective clothing and use sunscreen. Do not use sun lamps  or tanning beds/booths. You may need blood work done while you are taking this medicine. Call your doctor or health care professional for advice if you get a fever, chills or sore throat, or other symptoms of a cold or flu. Do not treat yourself. This drug decreases your body's ability to fight infections. Try to avoid being around people who are sick. This medicine may increase your risk to bruise or bleed. Call your doctor or health care professional if you notice any unusual bleeding. Check with your doctor or health care professional if you get an attack of severe diarrhea, nausea and vomiting, or if you sweat a lot. The loss of too much body fluid can make it dangerous for you to take this medicine. Talk to your doctor about your risk of cancer. You may be more at risk for certain types of cancers if you take this medicine. Both men and women must use effective birth control with this medicine. Do not become pregnant while taking this medicine or until at least 1 normal menstrual cycle has occurred after stopping it. Women should inform their doctor if they wish to become pregnant or think they might be pregnant. Men should not father a child while taking this medicine and for 3 months after stopping it. There is a potential for serious side effects to an unborn child. Talk to your health care professional or pharmacist for more information. Do not breast-feed an infant while taking this medicine. What side effects may I notice from receiving this medicine? Side effects that you should report to your doctor or health care professional as soon as possible: -allergic reactions like skin rash, itching or hives, swelling of the face, lips, or tongue -breathing problems or shortness of breath -diarrhea -dry, nonproductive cough -low blood counts - this medicine may decrease the number of white blood cells, red blood cells and platelets. You may be at increased risk for infections and bleeding. -mouth  sores -redness, blistering, peeling or loosening of the skin, including inside the mouth -signs of infection - fever or chills, cough, sore throat, pain or trouble passing urine -signs and symptoms of bleeding such as bloody or black, tarry stools; red or dark-brown urine; spitting up blood or brown material that looks like coffee grounds; red spots on the skin; unusual bruising or bleeding from the eye, gums, or nose -signs and symptoms of kidney injury like trouble passing urine or change in the amount of urine -signs and symptoms of liver injury like dark yellow or brown urine; general ill feeling or flu-like symptoms; light-colored stools; loss of appetite; nausea; right upper belly pain; unusually weak or tired; yellowing of the eyes or skin Side effects that usually do not require medical attention (report to your doctor or health care professional if they continue or are bothersome): -dizziness -hair loss -tiredness -upset stomach -vomiting This list may not describe all possible side effects. Call your doctor for medical advice about side effects. You may report side effects to FDA at 1-800-FDA-1088. Where should I keep my medicine? Keep out of the reach of children. Store at room temperature between 20 and 25 degrees C (68 and 77 degrees F). Protect from light. Throw away any unused medicine after the expiration date. NOTE: This sheet is a summary. It may not cover all possible information. If you have questions about this medicine, talk to your doctor,  pharmacist, or health care provider.  2018 Elsevier/Gold Standard (2015-03-19 05:39:22)

## 2016-09-30 LAB — GLUCOSE 6 PHOSPHATE DEHYDROGENASE: G-6PDH: 18.3 U/g{Hb} (ref 7.0–20.5)

## 2016-10-01 LAB — QUANTIFERON TB GOLD ASSAY (BLOOD)
Interferon Gamma Release Assay: NEGATIVE
Mitogen-Nil: 2.67 IU/mL
Quantiferon Nil Value: 0.04 IU/mL

## 2016-10-01 NOTE — Progress Notes (Signed)
Okay to start him on methotrexate 4 tablets by mouth every week along with folic acid

## 2016-10-02 ENCOUNTER — Other Ambulatory Visit: Payer: Self-pay | Admitting: *Deleted

## 2016-10-02 NOTE — Telephone Encounter (Signed)
-----   Message from Chesley MiresRachel L Henderson, [facility] sent at 09/29/2016 12:27 PM EST ----- Regarding: Methotrexate Initiation Following Repeat TB Gold Plan per Dr. Corliss Skainseveshwar on 09/29/16 visit: "My plan is to start him on 4 tablets by mouth every week along with folic acid 1 mg by mouth daily. We'll check lab work in 2 weeks then every 2 months. If need be the methotrexate dose can be increased further I'm hesitant to increase the dose to much due to his age and multiple medications he is taking. He will need TB gold today as his TB gold was indeterminate. Prior to starting his methotrexate."

## 2016-10-02 NOTE — Telephone Encounter (Signed)
Patient returned your call.  He has some questions in regards to the MTX.  Cb#[phone].  Thank you.

## 2016-10-03 LAB — BODY FLUID CULTURE
GRAM STAIN: NONE SEEN
Organism ID, Bacteria: NO GROWTH

## 2016-10-03 MED ORDER — METHOTREXATE 2.5 MG PO TABS: 10.0000 mg | ORAL_TABLET | ORAL | 0 refills | Status: DC

## 2016-10-03 MED ORDER — FOLIC ACID 1 MG PO TABS: 1.0000 mg | ORAL_TABLET | Freq: Every day | ORAL | 4 refills | Status: DC

## 2016-10-03 NOTE — Telephone Encounter (Signed)
I spoke to patient regarding methotrexate.  He is concerned about feeling unwell after taking methotrexate and wanted to know the best time to take the methotrexate.  I reviewed that some patients do have some nausea/feel unwell the day after the weekly methotrexate dose.  I advised patient that evening may be a good time to take the methotrexate.  Reviewed dosing of methotrexate (4 tablets/week) and folic acid (1 mg daily) with patient.  Counseled patient that he will need standing labs two weeks after starting methotrexate.  Also advised that warfarin level should be monitored closely while on methotrexate.  Noted patient had anti-coagulation visit on 10/01/16, plan per Coumadin clinic: "Seronegative rheumatoid arthritis. Agree with methotrexate treatment. They are awaiting his TB Gold test first. Methotrexate will affect his Coumadin. We will continue present dose of Coumadin and recheck in 3 weeks."  Patient denied any further questions at this time.    Lilla Shookachel Nima Kemppainen, Pharm.D., BCPS, CPP Clinical Pharmacist Pager: [phone] Phone: [phone] 10/03/2016 8:57 AM

## 2016-10-17 ENCOUNTER — Telehealth: Payer: Self-pay | Admitting: Radiology

## 2016-10-17 ENCOUNTER — Encounter: Payer: Self-pay | Admitting: Pharmacist

## 2016-10-17 ENCOUNTER — Other Ambulatory Visit: Payer: Self-pay | Admitting: Radiology

## 2016-10-17 DIAGNOSIS — Z79899 Other long term (current) drug therapy: Secondary | ICD-10-CM

## 2016-10-17 LAB — COMPLETE METABOLIC PANEL WITH GFR
ALT: 14 U/L (ref 9–46)
AST: 15 U/L (ref 10–35)
Albumin: 3.6 g/dL (ref 3.6–5.1)
Alkaline Phosphatase: 85 U/L (ref 40–115)
BUN: 15 mg/dL (ref 7–25)
CO2: 29 mmol/L (ref 20–31)
CREATININE: 0.68 mg/dL — AB (ref 0.70–1.18)
Calcium: 8.9 mg/dL (ref 8.6–10.3)
Chloride: 103 mmol/L (ref 98–110)
GFR, Est African American: >89 mL/min (ref >=60)
GFR, Est Non African American: >89 mL/min (ref >=60)
GLUCOSE: 100 mg/dL — AB (ref 65–99)
Potassium: 4 mmol/L (ref 3.5–5.3)
SODIUM: 140 mmol/L (ref 135–146)
TOTAL PROTEIN: 6.4 g/dL (ref 6.1–8.1)
Total Bilirubin: 0.6 mg/dL (ref 0.2–1.2)

## 2016-10-17 LAB — CBC WITH DIFFERENTIAL/PLATELET
BASOS PCT: 0 %
Basophils Absolute: 0 cells/uL (ref 0–200)
EOS ABS: 87 {cells}/uL (ref 15–500)
Eosinophils Relative: 1 %
HCT: 40.6 % (ref 38.5–50.0)
HEMOGLOBIN: 13 g/dL — AB (ref 13.2–17.1)
LYMPHS ABS: 2088 {cells}/uL (ref 850–3900)
Lymphocytes Relative: 24 %
MCH: 29 pg (ref 27.0–33.0)
MCHC: 32 g/dL (ref 32.0–36.0)
MCV: 90.6 fL (ref 80.0–100.0)
MONOS PCT: 9 %
MPV: 10 fL (ref 7.5–12.5)
Monocytes Absolute: 783 cells/uL (ref 200–950)
NEUTROS ABS: 5742 {cells}/uL (ref 1500–7800)
Neutrophils Relative %: 66 %
PLATELETS: 219 10*3/uL (ref 140–400)
RBC: 4.48 MIL/uL (ref 4.20–5.80)
RDW: 18.5 % — ABNORMAL HIGH (ref 11.0–15.0)
WBC: 8.7 10*3/uL (ref 3.8–10.8)

## 2016-10-17 NOTE — Progress Notes (Unsigned)
I spoke to Mr. Billy Hurley today when he came to the office to get blood work.  He had questions regarding methotrexate.  He reports he started methotrexate on 10/04/16 and has taken two doses.  He reports he has not noticed much improvement in his symptoms and wanted to know how long it takes for the medication to work.  I advised patient that it may take two to three months for methotrexate to work.  Patient reports he has completed his prednisone taper, and has had more joint pain since stopping prednisone.  Patient reports he has also restarted water aerobic classes and that may be contributing to his joint pain.  I offered to discuss restarting prednisone taper with Dr. Corliss Skainseveshwar, but patient declined prednisone at this time.  Advised him to call us if he changes his mind about prednisone taper.  Patient voiced understanding.     [name], Pharm.D., BCPS, CPP Clinical Pharmacist Pager: [phone] Phone: [phone] 10/17/2016 2:26 PM

## 2016-10-17 NOTE — Telephone Encounter (Signed)
Dr Orson AloeHenderson spoke to him in the office today, he is off prednisone. She states she has answered all his questions and there is not a need to call her.

## 2016-10-17 NOTE — Telephone Encounter (Signed)
Patient came in for his labs today, was complaining of hand pain. I have asked him if I can call him back. He has recently been on prednisone and is a new start with Methotrexate. Will call him and see what his current dose of Prednisone is.

## 2016-10-20 NOTE — Progress Notes (Signed)
Labs are stable.

## 2016-10-22 NOTE — Progress Notes (Signed)
Office Visit Note  Patient: Billy Hurley             Date of Birth: July 17, 1936           MRN: [id number]             PCP: Olena Mater, MD Referring: Olena Mater, MD Visit Date: 10/30/2016 Occupation: _0 @    Subjective:  Left knee pain   History of Present Illness: Billy Hurley is a 82 y.o. male with history of sero positive rheumatoid arthritis. According to him he did really well while he was on prednisone. He came off prednisone about 2 weeks ago and the symptoms is started flaring again. He has taken 4 doses of methotrexate so far at 4 tablets by mouth every week. Currently he is having pain and swelling in his bilateral knee joints. He's has lot of discomfort in his left knee joint with decreased range of motion. Left hip joint is painful. He has difficulty raising his shoulders. None of the other joints are painful or swollen.  Activities of Daily Living:  Patient reports morning stiffness for2 hours.   Patient Reports nocturnal pain.  Difficulty dressing/grooming: Denies Difficulty climbing stairs: Reports Difficulty getting out of chair: Reports Difficulty using hands for taps, buttons, cutlery, and/or writing: Denies   Review of Systems  Constitutional: Positive for fatigue. Negative for night sweats and weakness ( ).  HENT: Negative for mouth sores, mouth dryness and nose dryness.   Eyes: Negative for redness and dryness.  Respiratory: Negative for shortness of breath and difficulty breathing.   Cardiovascular: Negative for chest pain, palpitations, hypertension, irregular heartbeat and swelling in legs/feet.  Gastrointestinal: Negative for constipation and diarrhea.  Endocrine: Negative for increased urination.  Musculoskeletal: Positive for arthralgias, joint pain, joint swelling and morning stiffness. Negative for myalgias, muscle weakness, muscle tenderness and myalgias.  Skin: Negative for color change, rash, hair loss, nodules/bumps, skin tightness,  ulcers and sensitivity to sunlight.  Allergic/Immunologic: Negative for susceptible to infections.  Neurological: Negative for dizziness, fainting, memory loss and night sweats.  Hematological: Negative for swollen glands.  Psychiatric/Behavioral: Positive for sleep disturbance. Negative for depressed mood. The patient is not nervous/anxious.     PMFS History:  Patient Active Problem List   Diagnosis Date Noted  . Rheumatoid arthritis of multiple sites with negative rheumatoid factor ([place]) 10/28/2016  . High risk medication use 10/28/2016  . Idiopathic chronic gout of multiple sites without tophus 09/17/2016  . Bilateral knee effusions 09/17/2016  . History of gout 08/26/2016  . Primary osteoarthritis of both knees 08/26/2016  . Impingement syndrome of shoulder region 08/26/2016  . Essential hypertension 08/26/2016  . History of hyperglycemia 08/26/2016  . Chronic atrial fibrillation ([place]) 08/26/2016  . Other sleep apnea 08/26/2016    Past Medical History:  Diagnosis Date  . A-fib ([place])   . Gout   . Hypertension     Family History  Problem Relation Age of Onset  . Stroke Brother   . Hypertension Brother   . Hypertension Brother    Past Surgical History:  Procedure Laterality Date  . APPENDECTOMY    . TOTAL SHOULDER ARTHROPLASTY     Social History   Social History Narrative  . No narrative on file     Objective: Vital Signs: BP 138/78   Pulse 78   Resp 14   Ht 5' 9" (1.753 m)   Wt 188 lb (85.3 kg)   BMI 27.76 kg/m    Physical Exam  Constitutional: He  is oriented to person, place, and time. He appears well-developed and well-nourished.  HENT:  Head: Normocephalic and atraumatic.  Eyes: Conjunctivae and EOM are normal. Pupils are equal, round, and reactive to light.  Neck: Normal range of motion. Neck supple.  Cardiovascular: Normal rate, regular rhythm and normal heart sounds.   Pulmonary/Chest: Effort normal and breath sounds normal.  Abdominal: Soft.  Bowel sounds are normal.  Neurological: He is alert and oriented to person, place, and time.  Skin: Skin is warm and dry. Capillary refill takes less than 2 seconds.  Psychiatric: He has a normal mood and affect. His behavior is normal.  Nursing note and vitals reviewed.    Musculoskeletal Exam: Limited range of motion of the C-spine. Thoracic and lumbar spine good range of motion. Shoulder joint abduction is limited bilaterally. Elbow joints wrist joint MCPs PIPs DIPs with good range of motion with no synovitis. Hip joints are good range of motion. He had limited extension of his left knee joint. Warmth swelling and effusion in bilateral knee joints. Ankle joints MTPs PIPs with good range of motion with no synovitis.  CDAI Exam: No CDAI exam completed.    Investigation: Findings:  Patient brought x-ray of his bilateral knee joints from Dr. Jeoffrey Massed office which was reviewed and it showed moderate medial compartment narrowing and moderate patellofemoral narrowing. No chondrocalcinosis was noted.    Orders Only on 10/17/2016  Component Date Value Ref Range Status  . WBC 10/17/2016 8.7  3.8 - 10.8 K/uL Final  . RBC 10/17/2016 4.48  4.20 - 5.80 MIL/uL Final  . Hemoglobin 10/17/2016 13.0* 13.2 - 17.1 g/dL Final  . HCT 10/17/2016 40.6  38.5 - 50.0 % Final  . MCV 10/17/2016 90.6  80.0 - 100.0 fL Final  . MCH 10/17/2016 29.0  27.0 - 33.0 pg Final  . MCHC 10/17/2016 32.0  32.0 - 36.0 g/dL Final  . RDW 10/17/2016 18.5* 11.0 - 15.0 % Final  . Platelets 10/17/2016 219  140 - 400 K/uL Final  . MPV 10/17/2016 10.0  7.5 - 12.5 fL Final  . Neutro Abs 10/17/2016 5742  1,500 - 7,800 cells/uL Final  . Lymphs Abs 10/17/2016 2088  850 - 3,900 cells/uL Final  . Monocytes Absolute 10/17/2016 783  200 - 950 cells/uL Final  . Eosinophils Absolute 10/17/2016 87  15 - 500 cells/uL Final  . Basophils Absolute 10/17/2016 0  0 - 200 cells/uL Final  . Neutrophils Relative % 10/17/2016 66  % Final  . Lymphocytes  Relative 10/17/2016 24  % Final  . Monocytes Relative 10/17/2016 9  % Final  . Eosinophils Relative 10/17/2016 1  % Final  . Basophils Relative 10/17/2016 0  % Final  . Smear Review 10/17/2016 Criteria for review not met   Final  . Sodium 10/17/2016 140  135 - 146 mmol/L Final  . Potassium 10/17/2016 4.0  3.5 - 5.3 mmol/L Final  . Chloride 10/17/2016 103  98 - 110 mmol/L Final  . CO2 10/17/2016 29  20 - 31 mmol/L Final  . Glucose, Bld 10/17/2016 100* 65 - 99 mg/dL Final  . BUN 10/17/2016 15  7 - 25 mg/dL Final  . Creat 10/17/2016 0.68* 0.70 - 1.18 mg/dL Final   Comment:   For patients > or = 79 years of age: The upper reference limit for Creatinine is approximately 13% higher for people identified as African-American.     . Total Bilirubin 10/17/2016 0.6  0.2 - 1.2 mg/dL Final  . Alkaline Phosphatase  10/17/2016 85  40 - 115 U/L Final  . AST 10/17/2016 15  10 - 35 U/L Final  . ALT 10/17/2016 14  9 - 46 U/L Final  . Total Protein 10/17/2016 6.4  6.1 - 8.1 g/dL Final  . Albumin 10/17/2016 3.6  3.6 - 5.1 g/dL Final  . Calcium 10/17/2016 8.9  8.6 - 10.3 mg/dL Final  . GFR, Est African American 10/17/2016 >89  >=60 mL/min Final  . GFR, Est Non African American 10/17/2016 >89  >=60 mL/min Final  Office Visit on 09/29/2016  Component Date Value Ref Range Status  . Interferon Gamma Release Assay 09/29/2016 NEGATIVE  NEGATIVE Final  . Quantiferon Nil Value 09/29/2016 0.04  IU/mL Final  . Mitogen-Nil 09/29/2016 2.67  IU/mL Final  . Quantiferon Tb Ag Minus Nil Value 09/29/2016 <0.00  IU/mL Final   Comment:   The Nil tube value is used to determine if the patient has a preexisting immune response which could cause a false-positive reading on the test. In order for a test to be valid, the Nil tube must have a value of less than or equal to 8.0 IU/mL.   The mitogen control tube is used to assure the patient has a healthy immune status and also serves as a control for correct blood  handling and incubation. It is used to detect false-negative readings. The mitogen tube must have a gamma interferon value of greater than or equal to 0.5 IU/mL higher than the value of the Nil tube.   The TB antigen tube is coated with the M. tuberculosis specific antigens. For a test to be considered positive, the TB antigen tube value minus the Nil tube value must be greater than or equal to 0.35 IU/mL.   For additional information, please refer to http://education.questdiagnostics.com/faq/QFT (This link is being provided for informational/educational purposes only.)   . G-6PDH 09/29/2016 18.3  7.0 - 20.5 U/g Hgb Final  . Site 09/29/2016 RIGHT KNEE   Final  . Color, Synovial 09/29/2016 YELLOW  STRAW/YELLOW Final  . Appearance-Synovial 09/29/2016 HAZY  CLEAR/HAZY Final  . WBC, Synovial 09/29/2016 9440* <150 cells/uL Final  . Neutrophil, Synovial 09/29/2016 33* 0 - 24 % Final  . Lymphocytes-Synovial Fld 09/29/2016 56  0 - 74 % Final  . Monocyte/Macrophage 09/29/2016 11  0 - 69 % Final  . Eosinophils-Synovial 09/29/2016 0  0 - 2 % Final  . Basophils, % 09/29/2016 0  0 % Final  . Synoviocytes, % 09/29/2016 0  0 - 15 % Final  . Crystals 09/29/2016 SEE NOTE  NONE SEEN HPF Final  . Gram Stain 09/29/2016 Few   Final  . Gram Stain 09/29/2016 WBC present-both PMN and Mononuclear   Final  . Gram Stain 09/29/2016 No Organisms Seen   Final  . Organism ID, Bacteria 09/29/2016 NO GROWTH   Final     Imaging: No results found.  Speciality Comments: No specialty comments available.    Procedures:  No procedures performed Allergies: Patient has no known allergies.   Assessment / Plan:     Visit Diagnoses: Rheumatoid arthritis of multiple sites with negative rheumatoid factor (HCC)Positive CCP: He's been having a flare off prednisone. His been on methotrexate for about 4 weeks now which was low dose as he has not had good response to it so far. He has bilateral knee joint effusion which  is causing a lot of discomfort. As he had recent aspiration of bilateral knee joints but decided not to inject her aspirate today. Different  treatment options and the side effects were discussed. I would like to increase his methotrexate to 8 tablets by mouth every week as he is tolerant 4 tablets. Another course of prednisone as a bridging therapy will be given he will take 15 mg for one week, 10 mg for 2 weeks, 7.5 mg for 2 weeks, 5 mg for 2 weeks, 2.5 mg for 2 weeks. Methotrexate 8 tablets by mouth every week. Folic acid 2 mg by mouth daily.   High risk medication use when he is currently on methotrexate 4 tablets by mouth every week his labs have been normal. We will check his labs 2 weeks 2 and then every 2 months. If he has inadequate response to methotrexate we may have to use combination therapy or move onto [facility]olog[facility].   Idiopathic chronic gout of multiple sites without tophus: He's not had any flare of gout. He is on allopurinol 100 mg by mouth daily   Primary osteoarthritis of both knees old and followed up by Dr. Lorre Nick   Essential hypertension  History of hyperglycemia  Chronic atrial fibrillation ([place])  Other sleep apnea     Orders: No orders of the defined types were placed in this encounter.  Meds ordered this encounter  Medications  . predniSONE (DELTASONE) 5 MG tablet    Sig: Take 15 mg PO QD x 1 week, then 10 mg PO QD x 2 weeks, then 7.5 mg PO QD x 2 weeks, then 5 mg PO QD x 2 weeks, then 2.5 mg PO QD x 2 weeks    Dispense:  91 tablet    Refill:  0  . folic acid (FOLVITE) 1 MG tablet    Sig: Take 2 tablets (2 mg total) by mouth daily.    Dispense:  180 tablet    Refill:  4  . methotrexate (RHEUMATREX) 2.5 MG tablet    Sig: Take 8 tablets (20 mg total) by mouth once a week. Caution:Chemotherapy. Protect from light.    Dispense:  32 tablet    Refill:  1    Face-to-face time spent with patient was 30 minutes. 50% of time was spent in counseling and coordination of  care.  Follow-Up Instructions: Return in about 2 months (around 12/30/2016) for Rheumatoid arthritis.   Bo Merino, MD  Note - This record has been created using Editor, commissioning.  Chart creation errors have been sought, but may not always  have been located. Such creation errors do not reflect on  the standard of medical care.

## 2016-10-28 DIAGNOSIS — Z79899 Other long term (current) drug therapy: Secondary | ICD-10-CM | POA: Insufficient documentation

## 2016-10-28 DIAGNOSIS — M0609 Rheumatoid arthritis without rheumatoid factor, multiple sites: Secondary | ICD-10-CM | POA: Insufficient documentation

## 2016-10-30 ENCOUNTER — Ambulatory Visit (INDEPENDENT_AMBULATORY_CARE_PROVIDER_SITE_OTHER): Payer: Medicare Other | Admitting: Rheumatology

## 2016-10-30 ENCOUNTER — Encounter: Payer: Self-pay | Admitting: Rheumatology

## 2016-10-30 VITALS — BP 138/78 | HR 78 | Resp 14 | Ht 69.0 in | Wt 188.0 lb

## 2016-10-30 DIAGNOSIS — M1A09X0 Idiopathic chronic gout, multiple sites, without tophus (tophi): Secondary | ICD-10-CM | POA: Diagnosis not present

## 2016-10-30 DIAGNOSIS — G4739 Other sleep apnea: Secondary | ICD-10-CM | POA: Diagnosis not present

## 2016-10-30 DIAGNOSIS — I10 Essential (primary) hypertension: Secondary | ICD-10-CM | POA: Diagnosis not present

## 2016-10-30 DIAGNOSIS — M7540 Impingement syndrome of unspecified shoulder: Secondary | ICD-10-CM

## 2016-10-30 DIAGNOSIS — M0609 Rheumatoid arthritis without rheumatoid factor, multiple sites: Secondary | ICD-10-CM

## 2016-10-30 DIAGNOSIS — Z8639 Personal history of other endocrine, nutritional and metabolic disease: Secondary | ICD-10-CM

## 2016-10-30 DIAGNOSIS — Z79899 Other long term (current) drug therapy: Secondary | ICD-10-CM

## 2016-10-30 DIAGNOSIS — I482 Chronic atrial fibrillation, unspecified: Secondary | ICD-10-CM

## 2016-10-30 DIAGNOSIS — M170 Bilateral primary osteoarthritis of knee: Secondary | ICD-10-CM

## 2016-10-30 MED ORDER — FOLIC ACID 1 MG PO TABS: 2.0000 mg | ORAL_TABLET | Freq: Every day | ORAL | 4 refills | Status: DC

## 2016-10-30 MED ORDER — PREDNISONE 5 MG PO TABS: ORAL_TABLET | ORAL | 0 refills | Status: DC

## 2016-10-30 MED ORDER — METHOTREXATE 2.5 MG PO TABS: 20.0000 mg | ORAL_TABLET | ORAL | 1 refills | Status: DC

## 2016-10-30 NOTE — Patient Instructions (Signed)
Prednisone taper using 5 mg tablets: Take prednisone 15 mg (3 tablets) by mouth once daily for one week, then take prednisone 10 mg (2 tablets) by mouth once daily for two weeks, then take prednisone 7.5 mg (1 and 1/2 tablets) by mouth once daily for two weeks, then take prednisone 5 mg (1 tablet) by mouth once daily for two weeks, then take prednisone 2.5 mg (1/2 tablet) by mouth once daily for two weeks, then  Increase methotrexate dose to 8 tablets weekly Increase folic acid dose to 2 mg daily  Standing Labs We placed an order today for your standing lab work.    Please come back and get your standing labs in 2 weeks, 4 weeks, then every 2 months.  We have open lab Monday through Friday from 8:30-11:30 AM and 1:30-4 PM at the office of Dr. Arbutus Ped, PA.   The office is located at [address], [address], [place], [place] [address] No appointment is necessary.   Labs are drawn by [employer].  You may receive a bill from [facility] for your lab work.

## 2016-10-30 NOTE — Progress Notes (Signed)
Pharmacy Note  Subjective: Patient presents today to the [facility] to see Dr. Corliss Skains for follow up of rheumatoid arthritis.  Patient was started on methotrexate on 10/03/16.  He reports tolerating the medication without any problems.    Objective: CBC    Component Value Date/Time   WBC 8.7 10/17/2016 1424   RBC 4.48 10/17/2016 1424   HGB 13.0 (L) 10/17/2016 1424   HCT 40.6 10/17/2016 1424   PLT 219 10/17/2016 1424   MCV 90.6 10/17/2016 1424   MCH 29.0 10/17/2016 1424   MCHC 32.0 10/17/2016 1424   RDW 18.5 (H) 10/17/2016 1424   LYMPHSABS 2,088 10/17/2016 1424   MONOABS 783 10/17/2016 1424   EOSABS 87 10/17/2016 1424   BASOSABS 0 10/17/2016 1424    CMP     Component Value Date/Time   NA 140 10/17/2016 1424   K 4.0 10/17/2016 1424   CL 103 10/17/2016 1424   CO2 29 10/17/2016 1424   GLUCOSE 100 (H) 10/17/2016 1424   BUN 15 10/17/2016 1424   CREATININE 0.68 (L) 10/17/2016 1424   CALCIUM 8.9 10/17/2016 1424   PROT 6.4 10/17/2016 1424   ALBUMIN 3.6 10/17/2016 1424   AST 15 10/17/2016 1424   ALT 14 10/17/2016 1424   ALKPHOS 85 10/17/2016 1424   BILITOT 0.6 10/17/2016 1424   GFRNONAA >89 10/17/2016 1424   GFRAA >89 10/17/2016 1424   Assessment/Plan:  Reviewed with patient instructions to increase methotrexate dose to 8 tablets by mouth weekly and folic acid dose to 2 mg daily.  I reviewed standing lab instructions with patient to get labs in 2 weeks, 4 weeks then every 2 months.  Also advised patient to continue to follow up with Dr. Tanda Rockers closely for monitoring of Coumadin level with the increase in methotrexate dose.  Patient confirms he has a coumadin check in 2 weeks.    Patient was also prescribed prednisone taper.  Counseled patient on the purpose, proper use, and adverse effects of prednisone.  Advised close monitoring of blood pressure and blood sugar while on prednisone.  Patient denies any questions or concerns regarding his medications at this time.     Lilla Shook, Pharm.D., BCPS Clinical Pharmacist Pager: [phone] Phone: [phone] 10/30/2016 2:43 PM

## 2016-11-06 ENCOUNTER — Telehealth: Payer: Self-pay | Admitting: Rheumatology

## 2016-11-06 NOTE — Telephone Encounter (Signed)
Patient left a message stating that he thinks he took too many MTX pills.  He would like Fleet Contras to call him.  CB#[phone].  Thank you.

## 2016-11-06 NOTE — Telephone Encounter (Signed)
I called Mr. Soltau.  He reports he increased his methotrexate dose to 8 tablets weekly as instructed and took the first dose on Saturday, 11/01/16.  He said he was setting his pills out today to go out of town and he thinks he accidentally took another dose of 8 tablets today.  I advised patient to skip methotrexate this Saturday.  Patient is due for labs next week.    Lilla Shook, Pharm.D., BCPS, CPP Clinical Pharmacist Pager: [phone] Phone: [phone] 11/06/2016 12:46 PM

## 2016-11-17 ENCOUNTER — Other Ambulatory Visit: Payer: Self-pay | Admitting: Radiology

## 2016-11-17 DIAGNOSIS — Z79899 Other long term (current) drug therapy: Secondary | ICD-10-CM

## 2016-11-17 LAB — COMPLETE METABOLIC PANEL WITH GFR
ALT: 23 U/L (ref 9–46)
AST: 22 U/L (ref 10–35)
Albumin: 3.8 g/dL (ref 3.6–5.1)
Alkaline Phosphatase: 84 U/L (ref 40–115)
BUN: 20 mg/dL (ref 7–25)
CALCIUM: 9.3 mg/dL (ref 8.6–10.3)
CHLORIDE: 101 mmol/L (ref 98–110)
CO2: 26 mmol/L (ref 20–31)
Creat: 0.76 mg/dL (ref 0.70–1.18)
GFR, EST NON AFRICAN AMERICAN: 87 mL/min (ref >=60)
Glucose, Bld: 105 mg/dL — ABNORMAL HIGH (ref 65–99)
POTASSIUM: 3.9 mmol/L (ref 3.5–5.3)
Sodium: 140 mmol/L (ref 135–146)
Total Bilirubin: 0.6 mg/dL (ref 0.2–1.2)
Total Protein: 7.1 g/dL (ref 6.1–8.1)

## 2016-11-18 ENCOUNTER — Telehealth: Payer: Self-pay | Admitting: Radiology

## 2016-11-18 LAB — CBC WITH DIFFERENTIAL/PLATELET
BASOS PCT: 0 %
Basophils Absolute: 0 cells/uL (ref 0–200)
Eosinophils Absolute: 0 cells/uL — ABNORMAL LOW (ref 15–500)
Eosinophils Relative: 0 %
HCT: 41.2 % (ref 38.5–50.0)
Hemoglobin: 13.5 g/dL (ref 13.2–17.1)
LYMPHS PCT: 16 %
Lymphs Abs: 1456 cells/uL (ref 850–3900)
MCH: 30.2 pg (ref 27.0–33.0)
MCHC: 32.8 g/dL (ref 32.0–36.0)
MCV: 92.2 fL (ref 80.0–100.0)
MONOS PCT: 6 %
MPV: 10.1 fL (ref 7.5–12.5)
Monocytes Absolute: 546 cells/uL (ref 200–950)
NEUTROS PCT: 78 %
Neutro Abs: 7098 cells/uL (ref 1500–7800)
PLATELETS: 249 10*3/uL (ref 140–400)
RBC: 4.47 MIL/uL (ref 4.20–5.80)
RDW: 17 % — AB (ref 11.0–15.0)
WBC: 9.1 10*3/uL (ref 3.8–10.8)

## 2016-11-18 NOTE — Telephone Encounter (Signed)
I have called patient to advise labs are normal  

## 2016-11-18 NOTE — Progress Notes (Signed)
WNL

## 2016-11-18 NOTE — Telephone Encounter (Signed)
-----   Message from Pollyann Savoy, MD sent at 11/18/2016 12:18 PM EDT ----- WNL

## 2016-11-18 NOTE — Telephone Encounter (Signed)
He states he did not get a full supply of the Folic acid from the pharmacy. I have advised him we did send this in for 2 a day #180 with 4 refills  He will ck with pharmacy since he only got a 15 day supply

## 2016-12-08 ENCOUNTER — Other Ambulatory Visit: Payer: Self-pay | Admitting: Rheumatology

## 2016-12-08 NOTE — Telephone Encounter (Signed)
Patient called requesting rf on MTX. Will be short one dose before he comes back for rov. Patient uses [facility] in [place]. Also, patient going to have labs done at GPs office on 6-22 . Please fax orders. Call patient when orders are faxed, and rx called in.

## 2016-12-08 NOTE — Telephone Encounter (Signed)
Last Visit: 10/30/16 Next Visit: 12/30/16 Labs: 11/17/16 WNL  Okay to refill MTX?

## 2016-12-09 MED ORDER — METHOTREXATE 2.5 MG PO TABS: 20.0000 mg | ORAL_TABLET | ORAL | 2 refills | Status: DC

## 2016-12-09 NOTE — Telephone Encounter (Signed)
ok 

## 2016-12-15 ENCOUNTER — Other Ambulatory Visit: Payer: Self-pay | Admitting: Radiology

## 2016-12-15 ENCOUNTER — Telehealth: Payer: Self-pay | Admitting: Rheumatology

## 2016-12-15 DIAGNOSIS — Z79899 Other long term (current) drug therapy: Secondary | ICD-10-CM

## 2016-12-15 NOTE — Telephone Encounter (Signed)
Left message to advise patient lab orders being faxed.

## 2016-12-15 NOTE — Telephone Encounter (Signed)
Patient is requesting lab orders be faxed to [phone] (he did not know if the lab is a [facility] or [facility]). Patient is requesting this be done today please.

## 2016-12-15 NOTE — Telephone Encounter (Signed)
I have faxed the orders to the number provided, thank you.

## 2016-12-15 NOTE — Telephone Encounter (Signed)
Patient calling to request lab orders be sent to Dr. Scharlene GlossZimmers office so he can go tomorrow for labs. Patient requests a call back today to let him know when orders are sent. He is out of town, and doesn't want to make a trip in without orders being there.

## 2016-12-18 NOTE — Progress Notes (Signed)
Office Visit Note  Patient: Billy Hurley             Date of Birth: 26-Sep-1935           MRN: [id number]             PCP: Christena Flake, MD Referring: Christena Flake, MD Visit Date: 12/30/2016 Occupation: @GUAROCC @    Subjective:  Pain of the Left Knee and Pain of the Right Knee   History of Present Illness: Billy Hurley is a 81 y.o. male with sero positive rheumatoid arthritis. He states he is doing better on methotrexate. He's been on 8 tablets of methotrexate for almost 6 weeks now. He was doing much better while he was on prednisone he has come down on the prednisone to 2.5 mg by mouth daily now and he has only one more tablet left. He has noticed increasing stiffness on the morning. He continues to have pain and stiffness in his bilateral knee joints. He denies any joint swelling. He does not have much discomfort in his hands. He denies any gout flare..  Activities of Daily Living:  Patient reports morning stiffness for 30 minutes.   Patient Reports nocturnal pain.  Difficulty dressing/grooming: Denies Difficulty climbing stairs: Reports Difficulty getting out of chair: Reports Difficulty using hands for taps, buttons, cutlery, and/or writing: Denies   Review of Systems  Constitutional: Negative for fatigue, night sweats and weakness ( ).  HENT: Negative for mouth sores, mouth dryness and nose dryness.   Eyes: Negative for redness and dryness.  Respiratory: Positive for shortness of breath. Negative for difficulty breathing.        On exertion  Cardiovascular: Negative for chest pain, palpitations, hypertension, irregular heartbeat and swelling in legs/feet.  Gastrointestinal: Negative for constipation and diarrhea.  Endocrine: Negative for increased urination.  Musculoskeletal: Positive for arthralgias, joint pain and morning stiffness. Negative for joint swelling, myalgias, muscle weakness, muscle tenderness and myalgias.  Skin: Negative for color change, rash, hair  loss, nodules/bumps, skin tightness, ulcers and sensitivity to sunlight.  Allergic/Immunologic: Negative for susceptible to infections.  Neurological: Negative for dizziness, fainting, memory loss and night sweats.  Hematological: Negative for swollen glands.  Psychiatric/Behavioral: Negative for depressed mood and sleep disturbance. The patient is not nervous/anxious.     PMFS History:  Patient Active Problem List   Diagnosis Date Noted  . Rheumatoid arthritis of multiple sites with negative rheumatoid factor (HCC) 10/28/2016  . High risk medication use 10/28/2016  . Idiopathic chronic gout of multiple sites without tophus 09/17/2016  . Bilateral knee effusions 09/17/2016  . History of gout 08/26/2016  . Primary osteoarthritis of both knees 08/26/2016  . Impingement syndrome of shoulder region 08/26/2016  . Essential hypertension 08/26/2016  . History of hyperglycemia 08/26/2016  . Chronic atrial fibrillation (HCC) 08/26/2016  . Other sleep apnea 08/26/2016    Past Medical History:  Diagnosis Date  . A-fib (HCC)   . Gout   . Hypertension     Family History  Problem Relation Age of Onset  . Stroke Brother   . Hypertension Brother   . Hypertension Brother    Past Surgical History:  Procedure Laterality Date  . APPENDECTOMY    . TOTAL SHOULDER ARTHROPLASTY     Social History   Social History Narrative  . No narrative on file     Objective: Vital Signs: BP 138/78   Pulse 74   Resp 14   Ht 5\' 8"  (1.727 m)   Wt 198 lb (  89.8 kg)   BMI 30.11 kg/m    Physical Exam  Constitutional: He is oriented to person, place, and time. He appears well-developed and well-nourished.  HENT:  Head: Normocephalic and atraumatic.  Eyes: Conjunctivae and EOM are normal. Pupils are equal, round, and reactive to light.  Neck: Normal range of motion. Neck supple.  Cardiovascular: Normal rate, regular rhythm and normal heart sounds.   Pulmonary/Chest: Effort normal and breath sounds  normal.  Abdominal: Soft. Bowel sounds are normal.  Neurological: He is alert and oriented to person, place, and time.  Skin: Skin is warm and dry. Capillary refill takes less than 2 seconds.  Psychiatric: He has a normal mood and affect. His behavior is normal.  Nursing note and vitals reviewed.    Musculoskeletal Exam: C-spine and thoracic spine good range of motion. Shoulder joints elbow joints wrist joints are good range of motion. He has thickening of PIP/DIP joints in his hands but no synovitis over MCPs noted. Hip joints are good range of motion. He is warmth and effusion his bilateral knee joints. MTPs PIPs with good range of motion with no synovitis.  CDAI Exam: CDAI Homunculus Exam:   Tenderness:  RLE: tibiofemoral LLE: tibiofemoral  Swelling:  RLE: tibiofemoral LLE: tibiofemoral  Joint Counts:  CDAI Tender Joint count: 2 CDAI Swollen Joint count: 2  Global Assessments:  Patient Global Assessment: 6 Provider Global Assessment: 5  CDAI Calculated Score: 15    Investigation: No additional findings. CBC Latest Ref Rng & Units 11/17/2016 10/17/2016 08/26/2016  WBC 3.8 - 10.8 K/uL 9.1 8.7 5.9  Hemoglobin 13.2 - 17.1 g/dL 16.1 13.0(L) 10.8(L)  Hematocrit 38.5 - 50.0 % 41.2 40.6 32.9(L)  Platelets 140 - 400 K/uL 249 219 362   CMP Latest Ref Rng & Units 11/17/2016 10/17/2016 08/26/2016  Glucose 65 - 99 mg/dL 096(E) 454(U) 91  BUN 7 - 25 mg/dL 20 15 14   Creatinine 0.70 - 1.18 mg/dL 9.81 1.91(Y) 7.82(N)  Sodium 135 - 146 mmol/L 140 140 133(L)  Potassium 3.5 - 5.3 mmol/L 3.9 4.0 4.3  Chloride 98 - 110 mmol/L 101 103 98  CO2 20 - 31 mmol/L 26 29 22   Calcium 8.6 - 10.3 mg/dL 9.3 8.9 9.1  Total Protein 6.1 - 8.1 g/dL 7.1 6.4 7.2  Total Bilirubin 0.2 - 1.2 mg/dL 0.6 0.6 0.7  Alkaline Phos 40 - 115 U/L 84 85 148(H)  AST 10 - 35 U/L 22 15 18   ALT 9 - 46 U/L 23 14 13    12/18/2016 CBC normal, CMP normal Imaging: No results found.  Speciality Comments: No specialty  comments available.    Procedures:  Large Joint Inj Date/Time: 12/30/2016 2:13 PM Performed by: Pollyann Savoy Authorized by: Pollyann Savoy   Consent Given by:  Patient Site marked: the procedure site was marked   Timeout: prior to procedure the correct patient, procedure, and site was verified   Indications:  Pain and joint swelling Location:  Knee Site:  L knee Prep: patient was prepped and draped in usual sterile fashion   Needle Size:  27 G Needle Length:  1.5 inches Approach:  Medial Ultrasound Guidance: No   Fluoroscopic Guidance: No   Arthrogram: No   Medications:  40 mg triamcinolone acetonide 40 MG/ML; 2 mL lidocaine 1 % Aspiration Attempted: Yes   Aspirate amount (mL):  20 Aspirate:  Clear Patient tolerance:  Patient tolerated the procedure well with no immediate complications Large Joint Inj Date/Time: 12/30/2016 2:35 PM Performed by: Pollyann Savoy Authorized  by: Yuchen Fedor, [facility]   Consent Given by:  Patient Site marked: the procedure site was marked   Timeout: prior to procedure the correct patient, procedure, and site was verified   Indications:  Pain and joint swelling Location:  Knee Site:  R knee Prep: patient was prepped and draped in usual sterile fashion   Needle Size:  27 G Needle Length:  1.5 inches Approach:  Medial Ultrasound Guidance: No   Fluoroscopic Guidance: No   Arthrogram: No   Medications:  40 mg triamcinolone acetonide 40 MG/ML; 2 mL lidocaine 1 % Aspiration Attempted: Yes   Aspirate amount (mL):  13 Aspirate:  Clear Patient tolerance:  Patient tolerated the procedure well with no immediate complications   Allergies: Patient has no known allergies.   Assessment / Plan:     Visit Diagnoses: Rheumatoid arthritis of multiple sites with negative rheumatoid factor (HCC) - neg RF and + CCP . History better on methotrexate but still continues to have a lot of pain and discomfort in his knee joints. He has some significant amount  of effusion in his bilateral knees today. He is not having adequate response to methotrexate 8 tablets by mouth every week. Different treatment options and their side effects were discussed. I plan to add Plaquenil to his methotrexate therapy and see how he does on combination therapy. It does not effectively can consider Biologics in the future. Based on his insurance IV Biologics therapy will be more useful.  High risk medication use - Methotrexate 8 tablets by mouth every week, folic acid 2 mg by mouth daily, prednisone 5 mg half tablet by mouth daily will be finishing tomorrow the last tablet.  Effusion of right knee: Informed consent was obtained different treatment options were discussed right knee joint was aspirated and injected as described above. The synovial fluid was sent for cell count crystals and culture  Effusion, left knee: The left knee joint was also aspirated and injected as described above.  Idiopathic chronic gout of multiple sites without tophus - Allopurinol 100 mg by mouth daily. I will check his uric acid with the next labs. He has not had a gout flare in long time.  Impingement syndrome of shoulder region, unspecified laterality. His shoulder joints are doing much better now.  Primary osteoarthritis of both knees: He does have some discomfort due to underlying osteoarthritis  History of hyperglycemia: His some blood glucose better as prednisone dose is decreased  History of hypertension: Blood pressure is better controlled  History of atrial fibrillation    Orders: Orders Placed This Encounter  Procedures  . Large Joint Injection/Arthrocentesis  . Large Joint Injection/Arthrocentesis  . Body fluid culture  . Uric acid  . VITAMIN D 25 Hydroxy (Vit-D Deficiency, Fractures)  . Synovial cell count + diff, w/ crystals   Meds ordered this encounter  Medications  . hydroxychloroquine (PLAQUENIL) 200 MG tablet    Sig: Take 1 tablet (200 mg total) by mouth 2 (two)  times daily.    Dispense:  60 tablet    Refill:  2    Face-to-face time spent with patient was 40 minutes. 50% of time was spent in counseling and coordination of care.  Follow-Up Instructions: Return in about 3 months (around 04/01/2017) for Rheumatoid arthritis.   Pollyann SavoyShaili  Grima, MD  Note - This record has been created using Animal [employer]agon software.  Chart creation errors have been sought, but may not always  have been located. Such creation errors do not reflect on  the  standard of medical care. 

## 2016-12-30 ENCOUNTER — Ambulatory Visit (INDEPENDENT_AMBULATORY_CARE_PROVIDER_SITE_OTHER): Payer: Medicare Other | Admitting: Rheumatology

## 2016-12-30 ENCOUNTER — Ambulatory Visit: Payer: Medicare Other | Admitting: Rheumatology

## 2016-12-30 ENCOUNTER — Encounter: Payer: Self-pay | Admitting: Rheumatology

## 2016-12-30 VITALS — BP 138/78 | HR 74 | Resp 14 | Ht 68.0 in | Wt 198.0 lb

## 2016-12-30 DIAGNOSIS — M25461 Effusion, right knee: Secondary | ICD-10-CM | POA: Diagnosis not present

## 2016-12-30 DIAGNOSIS — M1A09X0 Idiopathic chronic gout, multiple sites, without tophus (tophi): Secondary | ICD-10-CM

## 2016-12-30 DIAGNOSIS — M0609 Rheumatoid arthritis without rheumatoid factor, multiple sites: Secondary | ICD-10-CM

## 2016-12-30 DIAGNOSIS — M170 Bilateral primary osteoarthritis of knee: Secondary | ICD-10-CM | POA: Diagnosis not present

## 2016-12-30 DIAGNOSIS — Z8639 Personal history of other endocrine, nutritional and metabolic disease: Secondary | ICD-10-CM | POA: Diagnosis not present

## 2016-12-30 DIAGNOSIS — Z79899 Other long term (current) drug therapy: Secondary | ICD-10-CM

## 2016-12-30 DIAGNOSIS — M7540 Impingement syndrome of unspecified shoulder: Secondary | ICD-10-CM

## 2016-12-30 DIAGNOSIS — R5383 Other fatigue: Secondary | ICD-10-CM

## 2016-12-30 DIAGNOSIS — Z8679 Personal history of other diseases of the circulatory system: Secondary | ICD-10-CM

## 2016-12-30 DIAGNOSIS — M25462 Effusion, left knee: Secondary | ICD-10-CM

## 2016-12-30 MED ORDER — TRIAMCINOLONE ACETONIDE 40 MG/ML IJ SUSP
40.0000 mg | INTRAMUSCULAR | Status: AC | PRN
  Administered 2016-12-30: 40 mg via INTRA_ARTICULAR

## 2016-12-30 MED ORDER — HYDROXYCHLOROQUINE SULFATE 200 MG PO TABS: 200.0000 mg | ORAL_TABLET | Freq: Two times a day (BID) | ORAL | 2 refills | Status: DC

## 2016-12-30 MED ORDER — LIDOCAINE HCL 1 % IJ SOLN
2.0000 mL | INTRAMUSCULAR | Status: AC | PRN
  Administered 2016-12-30: 2 mL

## 2016-12-30 NOTE — Progress Notes (Signed)
Pharmacy Note  Subjective: Patient presents today to the [facility] to see Dr. Corliss Skainseveshwar.  Patient is currently taking methotrexate 8 tablets weekly and folic acid 2 mg daily.  Decision was made to add hydroxychloroquine today.  Patient seen by the pharmacist for counseling on hydroxychloroquine.    Objective: CMP Latest Ref Rng & Units 11/17/2016 10/17/2016 08/26/2016  Glucose 65 - 99 mg/dL 161(W105(H) 960(A100(H) 91  BUN 7 - 25 mg/dL 20 15 14   Creatinine 0.70 - 1.18 mg/dL 5.400.76 9.81(X0.68(L) 9.14(N0.56(L)  Sodium 135 - 146 mmol/L 140 140 133(L)  Potassium 3.5 - 5.3 mmol/L 3.9 4.0 4.3  Chloride 98 - 110 mmol/L 101 103 98  CO2 20 - 31 mmol/L 26 29 22   Calcium 8.6 - 10.3 mg/dL 9.3 8.9 9.1  Total Protein 6.1 - 8.1 g/dL 7.1 6.4 7.2  Total Bilirubin 0.2 - 1.2 mg/dL 0.6 0.6 0.7  Alkaline Phos 40 - 115 U/L 84 85 148(H)  AST 10 - 35 U/L 22 15 18   ALT 9 - 46 U/L 23 14 13    CBC    Component Value Date/Time   WBC 9.1 11/17/2016 1401   RBC 4.47 11/17/2016 1401   HGB 13.5 11/17/2016 1401   HCT 41.2 11/17/2016 1401   PLT 249 11/17/2016 1401   MCV 92.2 11/17/2016 1401   MCH 30.2 11/17/2016 1401   MCHC 32.8 11/17/2016 1401   RDW 17.0 (H) 11/17/2016 1401   LYMPHSABS 1,456 11/17/2016 1401   MONOABS 546 11/17/2016 1401   EOSABS 0 (L) 11/17/2016 1401   BASOSABS 0 11/17/2016 1401   Assessment/Plan: Patient was prescribed hydroxychloroquine 200 mg by mouth twice daily.  Patient was counseled on the purpose, proper use, and adverse effects of hydroxychloroquine including nausea/diarrhea, skin rash, headaches, and sun sensitivity.  Discussed importance of annual eye exams while on hydroxychloroquine to monitor to ocular toxicity and discussed importance of frequent laboratory monitoring.  Provided patient with eye exam form for baseline ophthalmologic exam and standing lab instructions.  Provided patient with educational materials on hydroxychloroquine and answered all questions.  Patient consented to  hydroxychloroquine.  Will upload consent in the media tab.    Lilla Shookachel Kianna Billet, Pharm.D., BCPS Clinical Pharmacist Pager: [phone] Phone: [phone] 12/30/2016 2:42 PM

## 2016-12-30 NOTE — Patient Instructions (Addendum)
Please schedule a Plaquenil eye exam with your eye doctor.    Standing Labs We placed an order today for your standing lab work.    Please come back and get your standing labs in 1 month then every 3 months Please check uric acid and vitamin D with next lab  We have open lab Monday through Friday from 8:30-11:30 AM and 1:30-4 PM at the office of Dr. Arbutus PedShaili Tamelia Michalowski/Naitik Panwala, PA.   The office is located at [address], [address], [place], [place] [address] No appointment is necessary.   Labs are drawn by [facility].  You may receive a bill from [facility] for your lab work. If you have any questions regarding directions or hours of operation,  please call [phone].   Hydroxychloroquine tablets What is this medicine? HYDROXYCHLOROQUINE (hye drox ee KLOR oh kwin) is used to treat rheumatoid arthritis and systemic lupus erythematosus. It is also used to treat malaria. This medicine may be used for other purposes; ask your health care provider or pharmacist if you have questions. COMMON BRAND NAME(S): Plaquenil, Quineprox What should I tell my health care provider before I take this medicine? They need to know if you have any of these conditions: -diabetes -eye disease, vision problems -G6PD deficiency -history of blood diseases -history of irregular heartbeat -if you often drink alcohol -kidney disease -liver disease -porphyria -psoriasis -seizures -an unusual or allergic reaction to chloroquine, hydroxychloroquine, other medicines, foods, dyes, or preservatives -pregnant or trying to get pregnant -breast-feeding How should I use this medicine? Take this medicine by mouth with a glass of water. Follow the directions on the prescription label. Avoid taking antacids within 4 hours of taking this medicine. It is best to separate these medicines by at least 4 hours. Do not cut, crush or chew this medicine. You can take it with or without food. If it upsets your stomach, take it  with food. Take your medicine at regular intervals. Do not take your medicine more often than directed. Take all of your medicine as directed even if you think you are better. Do not skip doses or stop your medicine early. Talk to your pediatrician regarding the use of this medicine in children. While this drug may be prescribed for selected conditions, precautions do apply. Overdosage: If you think you have taken too much of this medicine contact a poison control center or emergency room at once. NOTE: This medicine is only for you. Do not share this medicine with others. What if I miss a dose? If you miss a dose, take it as soon as you can. If it is almost time for your next dose, take only that dose. Do not take double or extra doses. What may interact with this medicine? Do not take this medicine with any of the following medications: -cisapride -dofetilide -dronedarone -live virus vaccines -penicillamine -pimozide -thioridazine -ziprasidone This medicine may also interact with the following medications: -ampicillin -antacids -cimetidine -cyclosporine -digoxin -medicines for diabetes, like insulin, glipizide, glyburide -medicines for seizures like carbamazepine, phenobarbital, phenytoin -mefloquine -methotrexate -other medicines that prolong the QT interval (cause an abnormal heart rhythm) -praziquantel This list may not describe all possible interactions. Give your health care provider a list of all the medicines, herbs, non-prescription drugs, or dietary supplements you use. Also tell them if you smoke, drink alcohol, or use illegal drugs. Some items may interact with your medicine. What should I watch for while using this medicine? Tell your doctor or healthcare professional if your symptoms do not  start to get better or if they get worse. Avoid taking antacids within 4 hours of taking this medicine. It is best to separate these medicines by at least 4 hours. Tell your doctor  or health care professional right away if you have any change in your eyesight. Your vision and blood may be tested before and during use of this medicine. This medicine can make you more sensitive to the sun. Keep out of the sun. If you cannot avoid being in the sun, wear protective clothing and use sunscreen. Do not use sun lamps or tanning beds/booths. What side effects may I notice from receiving this medicine? Side effects that you should report to your doctor or health care professional as soon as possible: -allergic reactions like skin rash, itching or hives, swelling of the face, lips, or tongue -changes in vision -decreased hearing or ringing of the ears -redness, blistering, peeling or loosening of the skin, including inside the mouth -seizures -sensitivity to light -signs and symptoms of a dangerous change in heartbeat or heart rhythm like chest pain; dizziness; fast or irregular heartbeat; palpitations; feeling faint or lightheaded, falls; breathing problems -signs and symptoms of liver injury like dark yellow or brown urine; general ill feeling or flu-like symptoms; light-colored stools; loss of appetite; nausea; right upper belly pain; unusually weak or tired; yellowing of the eyes or skin -signs and symptoms of low blood sugar such as feeling anxious; confusion; dizziness; increased hunger; unusually weak or tired; sweating; shakiness; cold; irritable; headache; blurred vision; fast heartbeat; loss of consciousness -uncontrollable head, mouth, neck, arm, or leg movements Side effects that usually do not require medical attention (report to your doctor or health care professional if they continue or are bothersome): -anxious -diarrhea -dizziness -hair loss -headache -irritable -loss of appetite -nausea, vomiting -stomach pain This list may not describe all possible side effects. Call your doctor for medical advice about side effects. You may report side effects to FDA at  1-800-FDA-1088. Where should I keep my medicine? Keep out of the reach of children. In children, this medicine can cause overdose with small doses. Store at room temperature between 15 and 30 degrees C (59 and 86 degrees F). Protect from moisture and light. Throw away any unused medicine after the expiration date. NOTE: This sheet is a summary. It may not cover all possible information. If you have questions about this medicine, talk to your doctor, pharmacist, or health care provider.  2018 Elsevier/Gold Standard (2016-02-27 14:16:15)

## 2016-12-31 LAB — SYNOVIAL CELL COUNT + DIFF, W/ CRYSTALS
Basophils, %: 0 %
EOSINOPHILS-SYNOVIAL: 0 % (ref 0–2)
Lymphocytes-Synovial Fld: 25 % (ref 0–74)
MONOCYTE/MACROPHAGE: 32 % (ref 0–69)
NEUTROPHIL, SYNOVIAL: 43 % — AB (ref 0–24)
Synoviocytes, %: 0 % (ref 0–15)
WBC, Synovial: 8689 cells/uL — ABNORMAL HIGH (ref <150)

## 2016-12-31 NOTE — Progress Notes (Signed)
Syno fluid- inflamm arthritis

## 2017-01-04 LAB — BODY FLUID CULTURE
Gram Stain: NONE SEEN
Organism ID, Bacteria: NO GROWTH

## 2017-01-06 NOTE — Progress Notes (Signed)
inflammatory

## 2017-01-06 NOTE — Progress Notes (Signed)
Cx

## 2017-01-06 NOTE — Progress Notes (Signed)
Cx neg

## 2017-01-07 ENCOUNTER — Telehealth: Payer: Self-pay | Admitting: Radiology

## 2017-01-07 NOTE — Telephone Encounter (Signed)
Thanks I have called him to advise. I have discussed with his wife.

## 2017-01-07 NOTE — Telephone Encounter (Signed)
Once his arthritis is welll controlled it should not recurr

## 2017-01-07 NOTE — Telephone Encounter (Signed)
Patient would like to know if knee infusion  will continue keep coming back and if we will be doing anything different as far as treatment?   He is on MTX you have added PLQ on 12/30/16

## 2017-01-29 ENCOUNTER — Telehealth: Payer: Self-pay | Admitting: Rheumatology

## 2017-01-29 NOTE — Telephone Encounter (Signed)
Patient would like to get his labs done in [place] where he lives and wants you to fax his order to fax#[phone].  Thank you.

## 2017-01-30 ENCOUNTER — Other Ambulatory Visit: Payer: Self-pay | Admitting: *Deleted

## 2017-01-30 DIAGNOSIS — M1A09X0 Idiopathic chronic gout, multiple sites, without tophus (tophi): Secondary | ICD-10-CM

## 2017-01-30 DIAGNOSIS — R5383 Other fatigue: Secondary | ICD-10-CM

## 2017-01-30 DIAGNOSIS — Z79899 Other long term (current) drug therapy: Secondary | ICD-10-CM

## 2017-01-30 NOTE — Telephone Encounter (Signed)
Lab orders have been faxed to [place] per patient's request.

## 2017-01-30 NOTE — Telephone Encounter (Signed)
Patient called again about getting his labs done in [place].  He wants to get them done today.  He leaves on vacation on Sunday.

## 2017-02-12 ENCOUNTER — Telehealth: Payer: Self-pay

## 2017-02-12 NOTE — Telephone Encounter (Signed)
Please advise 

## 2017-02-12 NOTE — Telephone Encounter (Signed)
I do not have any information or knowledge about CBD oil.

## 2017-02-12 NOTE — Telephone Encounter (Signed)
Patient wife called stating that patient is still having pain and would like to know if patient can buy some CBD Oil from the health food store and take it with his current medications.  Cb# is [phone].  Please advise.  Thank You.

## 2017-02-13 NOTE — Telephone Encounter (Signed)
Patient's wife advised that Dr, Corliss Skainseveshwar does not have any knowledge or information regarding CBD oil., therefore we could not recommend using it. She verbalized understanding.

## 2017-02-17 ENCOUNTER — Telehealth: Payer: Self-pay | Admitting: Rheumatology

## 2017-02-17 NOTE — Telephone Encounter (Signed)
[facility] C[facility] WNL Uric Acid 5.1 Vitamin D 46  Patient advised and also advised PLQ eye exam has been received.

## 2017-02-17 NOTE — Telephone Encounter (Signed)
Patient had lab work done in [place] two weeks ago. Patient had not heard anything, and was wanting results. Also, wants to make sure we received Eye exam report. Doctor was to fax over report for patient. Please call to confirm.

## 2017-02-25 ENCOUNTER — Telehealth: Payer: Self-pay | Admitting: Rheumatology

## 2017-02-25 NOTE — Telephone Encounter (Signed)
Patient states after Plaquenil was added to his regimen he felt great for awhile, but now he is not feeling better. Please call to advise patient.

## 2017-02-26 ENCOUNTER — Telehealth: Payer: Self-pay | Admitting: Rheumatology

## 2017-02-26 NOTE — Telephone Encounter (Signed)
Patient called again wanting to know if someone can call him. He had so much pain over night, and would like to know if he can get some pain meds.

## 2017-02-26 NOTE — Telephone Encounter (Signed)
Please, sch appt next week .

## 2017-02-26 NOTE — Telephone Encounter (Signed)
Patient has been scheduled for an appointment on 03/04/17.

## 2017-02-26 NOTE — Telephone Encounter (Signed)
See previous phone note.  

## 2017-02-26 NOTE — Telephone Encounter (Signed)
Patient states he has been having trouble with pain in his left hip down to his ankle. Patient states he is having trouble getting up out of a chair or getting in and our of the car. Patient states he is using tylenol with no relief.  Patient states he had a prescription of Tramadol from a different doctor in which he has been taking the last day. Patient states the tramadol dulled the pain. Patient is on MTX 8 tablets and PLQ 2 tablets daily. Patient has been on the PLQ for 6 weeks approximately. Patient states he is having trouble sleeping as well.

## 2017-03-02 ENCOUNTER — Telehealth: Payer: Self-pay | Admitting: Rheumatology

## 2017-03-02 NOTE — Telephone Encounter (Signed)
Patient has a follow up scheduled for 04/02/17. Patient is currently on MTX 8 Tabs weekly and PLQ 1 tab BID.

## 2017-03-02 NOTE — Telephone Encounter (Signed)
Patient went to GP on Friday for pain. GP gave patient a low dose of Prednisone, and patient feels great now. Patient canceled appt with Panwala for 03/04/17. Patient would like for you to call him back to advise him.

## 2017-03-03 ENCOUNTER — Telehealth: Payer: Self-pay | Admitting: Rheumatology

## 2017-03-03 NOTE — Telephone Encounter (Signed)
Given Prednisone 3 tabs daily for 1 week then 2 tabs daily x 1 week then 1 tab daily x 1 week then 1/2 tab daily x 1 week then d/c by PCP.

## 2017-03-03 NOTE — Telephone Encounter (Signed)
Patient was prescribed prednisone by his PCP and was given the 10mg  instead of 5mg  (which he says is what Dr. Corliss Skainseveshwar normally prescribes him) He states he has gotten immediate relief from the prednisone and would like to confirmation as to how he is tapering the prednisone since he is used to getting 5mg . Please call patient to advise.

## 2017-03-03 NOTE — Telephone Encounter (Signed)
Patient advised to follow directions as the PCP written. Patient advised will discuss treatment at follow up visit.

## 2017-03-04 ENCOUNTER — Ambulatory Visit: Payer: Self-pay | Admitting: Rheumatology

## 2017-03-04 ENCOUNTER — Other Ambulatory Visit: Payer: Self-pay | Admitting: Rheumatology

## 2017-03-04 NOTE — Telephone Encounter (Signed)
Last Visit: 12/30/16 Next Visit: 04/02/17 [facility]  Okay to refill per Dr Corliss Skainseveshwar

## 2017-03-23 ENCOUNTER — Other Ambulatory Visit: Payer: Self-pay | Admitting: Rheumatology

## 2017-03-23 ENCOUNTER — Telehealth: Payer: Self-pay | Admitting: Radiology

## 2017-03-23 NOTE — Telephone Encounter (Signed)
Last Visit: 12/30/16 Next Visit: 04/02/17 Labs: 01/30/17 at [facility] are CBC/ CMP WNL PLQ Eye Exam: 02/04/17 WNL   Okay to refill per Dr. Corliss Skains

## 2017-03-23 NOTE — Telephone Encounter (Signed)
Prescription sent to the pharmacy 03/23/17.

## 2017-03-23 NOTE — Telephone Encounter (Signed)
Refill request received via fax for Plaquenil from [facility]

## 2017-03-27 NOTE — Progress Notes (Signed)
Office Visit Note  Patient: Billy Hurley             Date of Birth: 05/27/1936           MRN: [id number]             PCP: Christena FlakeZimmer, [name], MD Referring: Christena FlakeZimmer, [name], MD Visit Date: 04/02/2017 Occupation: @GUAROCC @    Subjective:  Medication Management (wants Rx for prn prednisone ) and Joint Pain   History of Present Illness: Billy Hurley is a 81 y.o. male history of rheumatoid arthritis and gouty arthropathy. He states on August 2 he started having pain and swelling in his joints the point he was having difficulty walking area and he went to see his PCP started him on prednisone. Patient recalls taking prednisone 30 mg a day with gradual taper. He states he did really well while he was on prednisone and the symptoms have recurred after tapering off prednisone. He had a flare about 3 days ago for which he took total 30 mg of prednisone for 1 day only. He is doing better now. He continues to have discomfort in his multiple joints. His knee joints continue to hurt.  Activities of Daily Living Patient reports morning stiffness fordenies morning stiffness.   Patient Reports nocturnal pain. Knees and temporal areas of head Difficulty dressing/grooming: Reports Difficulty climbing stairs: Reports Difficulty getting out of chair: Reports has a chair lift  Difficulty using hands for taps, buttons, cutlery, and/or writing: Denies   Review of Systems  Constitutional: Negative.  Negative for fatigue, night sweats and weakness ( ).  HENT: Negative.  Negative for mouth sores, mouth dryness and nose dryness.   Eyes: Negative.  Negative for redness and dryness.  Respiratory: Negative.  Negative for shortness of breath and difficulty breathing.   Cardiovascular: Negative.  Negative for chest pain, palpitations, hypertension, irregular heartbeat and swelling in legs/feet.  Gastrointestinal: Negative.  Negative for constipation and diarrhea.  Endocrine: Negative.  Negative for increased  urination.  Genitourinary: Negative.   Musculoskeletal: Positive for arthralgias, gait problem and joint pain. Negative for joint swelling, myalgias, muscle weakness, morning stiffness, muscle tenderness and myalgias.  Skin: Negative for color change, rash, hair loss, nodules/bumps, skin tightness, ulcers and sensitivity to sunlight.  Allergic/Immunologic: Negative for susceptible to infections.  Neurological: Negative for dizziness, fainting, memory loss and night sweats.  Hematological: Negative.  Negative for swollen glands.  Psychiatric/Behavioral: Positive for sleep disturbance. Negative for depressed mood. The patient is not nervous/anxious.     PMFS History:  Patient Active Problem List   Diagnosis Date Noted  . Rheumatoid arthritis of multiple sites with negative rheumatoid factor (HCC) 10/28/2016  . High risk medication use 10/28/2016  . Idiopathic chronic gout of multiple sites without tophus 09/17/2016  . Bilateral knee effusions 09/17/2016  . History of gout 08/26/2016  . Primary osteoarthritis of both knees 08/26/2016  . Impingement syndrome of shoulder region 08/26/2016  . Essential hypertension 08/26/2016  . History of hyperglycemia 08/26/2016  . Chronic atrial fibrillation (HCC) 08/26/2016  . Other sleep apnea 08/26/2016    Past Medical History:  Diagnosis Date  . A-fib (HCC)   . Gout   . Hypertension     Family History  Problem Relation Age of Onset  . Stroke Brother   . Hypertension Brother   . Hypertension Brother    Past Surgical History:  Procedure Laterality Date  . APPENDECTOMY    . TOTAL SHOULDER ARTHROPLASTY     Social History  Social History Narrative  . No narrative on file     Objective: Vital Signs: BP 120/62   Pulse 68   Resp 16   Ht 5\' 8"  (1.727 m)   Wt 183 lb (83 kg)   BMI 27.83 kg/m    Physical Exam  Constitutional: He is oriented to person, place, and time. He appears well-developed and well-nourished.  HENT:  Head:  Normocephalic and atraumatic.  Eyes: Pupils are equal, round, and reactive to light. Conjunctivae and EOM are normal.  Neck: Normal range of motion. Neck supple.  Cardiovascular: Normal rate, regular rhythm and normal heart sounds.   Pulmonary/Chest: Effort normal and breath sounds normal.  Abdominal: Soft. Bowel sounds are normal.  Neurological: He is alert and oriented to person, place, and time.  Skin: Skin is warm and dry. Capillary refill takes less than 2 seconds.  Psychiatric: He has a normal mood and affect. His behavior is normal.  Nursing note and vitals reviewed.    Musculoskeletal Exam: C-spine limited range of motion. Thoracic and lumbar spine limited range of motion. Shoulder joints elbow joints, wrist joints, MCPs, PIPs and DIPs were in good range of motion. No synovitis was noted. Hip joints are good range of motion. He had warmth swelling and effusion in bilateral knee joints. Ankle joints and MTPs PIPs are good range of motion with no synovitis.   CDAI Exam: CDAI Homunculus Exam:   Tenderness:  RLE: tibiofemoral LLE: tibiofemoral  Swelling:  RLE: tibiofemoral LLE: tibiofemoral  Joint Counts:  CDAI Tender Joint count: 2 CDAI Swollen Joint count: 2  Global Assessments:  Patient Global Assessment: 6 Provider Global Assessment: 6  CDAI Calculated Score: 16    Investigation: Findings:  09/29/2016 negative TB gold   CBC Latest Ref Rng & Units 11/17/2016 10/17/2016 08/26/2016  WBC 3.8 - 10.8 K/uL 9.1 8.7 5.9  Hemoglobin 13.2 - 17.1 g/dL 09.8 13.0(L) 10.8(L)  Hematocrit 38.5 - 50.0 % 41.2 40.6 32.9(L)  Platelets 140 - 400 K/uL 249 219 362   CMP Latest Ref Rng & Units 11/17/2016 10/17/2016 08/26/2016  Glucose 65 - 99 mg/dL 119(J) 478(G) 91  BUN 7 - 25 mg/dL 20 15 14   Creatinine 0.70 - 1.18 mg/dL 9.56 2.13(Y) 8.65(H)  Sodium 135 - 146 mmol/L 140 140 133(L)  Potassium 3.5 - 5.3 mmol/L 3.9 4.0 4.3  Chloride 98 - 110 mmol/L 101 103 98  CO2 20 - 31 mmol/L 26 29 22     Calcium 8.6 - 10.3 mg/dL 9.3 8.9 9.1  Total Protein 6.1 - 8.1 g/dL 7.1 6.4 7.2  Total Bilirubin 0.2 - 1.2 mg/dL 0.6 0.6 0.7  Alkaline Phos 40 - 115 U/L 84 85 148(H)  AST 10 - 35 U/L 22 15 18   ALT 9 - 46 U/L 23 14 13   01/2017 Uric acid 5.1, CBC normal, CMP normal, Vit D 46  Imaging: No results found.  Speciality Comments: No specialty comments available.    Procedures:  No procedures performed Allergies: Patient has no known allergies.   Assessment / Plan:     Visit Diagnoses: Rheumatoid arthritis of multiple sites with negative rheumatoid factor [facility]): Patient has severe disease with ongoing pain and discomfort. He had recent flare which required prednisone taper. He is having flare again off prednisone. Different treatment options and their side effects were discussed at length. Based on his insurance plan We will apply for subcutaneous Humira. Patient lives in [place] and IV infusion will not be convenient for him.  Medication counseling:  Counseled patient that Humira is a TNF blocking agent.  Reviewed Humira dose of 40 mg every other week.  Counseled patient on purpose, proper use, and adverse effects of Humira.  Reviewed the most common adverse effects including infections, headache, and injection site reactions. Discussed that there is the possibility of an increased risk of malignancy but it is not well understood if this increased risk is due to the medication or the disease state.  Advised patient to get yearly dermatology exams due to risk of skin cancer.  Reviewed the importance of regular labs while on Humira therapy.  Advised patient to get standing labs one month after starting Humira then every 3 months.  Provided patient with standing lab orders.  Counseled patient that Humira should be held prior to scheduled surgery.  Counseled patient to avoid live vaccines while on Humira.  Advised patient to get annual influenza vaccine and the pneumococcal vaccine as needed.  Provided  patient with medication education material and answered all questions.  Patient voiced understanding.  Patient consented to Humira.  Will upload consent into the media tab.  Reviewed storage instructions of Humira.  Advised initial injection must be administered in office.    Patient voiced understanding.      High risk medication use - Methotrexate 8 tablets by mouth every week, folic acid 2 mg by mouth daily and Plaquenil 200 mg by mouth twice a day. I will give him prednisone as a bridging therapy. He will stay on prednisone 10 mg by mouth daily for right now until Humira is a started.  Idiopathic chronic gout of multiple sites without tophus - On allopurinol 100 mg by mouth daily  Impingement syndrome of both shoulders: He has limited range of motion of shoulder joints.  Primary osteoarthritis of both knees: Chronic pain  Bilateral knee effusions fistula bilateral knee joint effusion.  History of hyperglycemia  History of atrial fibrillation - he is on warfarin  History of hypertension    Orders: No orders of the defined types were placed in this encounter.  No orders of the defined types were placed in this encounter.   Face-to-face time spent with patient was  minutes. Greater than 50% of time was spent in counseling and coordination of care.  Follow-Up Instructions: Return in about 3 months (around 07/02/2017) for Rheumatoid arthritis, Gout, Osteoarthritis.   Pollyann Savoy, MD  Note - This record has been created using Animal nutritionist.  Chart creation errors have been sought, but may not always  have been located. Such creation errors do not reflect on  the standard of medical care.

## 2017-04-02 ENCOUNTER — Encounter: Payer: Self-pay | Admitting: Rheumatology

## 2017-04-02 ENCOUNTER — Ambulatory Visit (INDEPENDENT_AMBULATORY_CARE_PROVIDER_SITE_OTHER): Payer: Medicare Other | Admitting: Rheumatology

## 2017-04-02 ENCOUNTER — Telehealth: Payer: Self-pay

## 2017-04-02 VITALS — BP 120/62 | HR 68 | Resp 16 | Ht 68.0 in | Wt 183.0 lb

## 2017-04-02 DIAGNOSIS — M7542 Impingement syndrome of left shoulder: Secondary | ICD-10-CM

## 2017-04-02 DIAGNOSIS — M0609 Rheumatoid arthritis without rheumatoid factor, multiple sites: Secondary | ICD-10-CM

## 2017-04-02 DIAGNOSIS — M25462 Effusion, left knee: Secondary | ICD-10-CM | POA: Diagnosis not present

## 2017-04-02 DIAGNOSIS — M7541 Impingement syndrome of right shoulder: Secondary | ICD-10-CM

## 2017-04-02 DIAGNOSIS — M170 Bilateral primary osteoarthritis of knee: Secondary | ICD-10-CM | POA: Diagnosis not present

## 2017-04-02 DIAGNOSIS — Z79899 Other long term (current) drug therapy: Secondary | ICD-10-CM | POA: Diagnosis not present

## 2017-04-02 DIAGNOSIS — Z8639 Personal history of other endocrine, nutritional and metabolic disease: Secondary | ICD-10-CM | POA: Diagnosis not present

## 2017-04-02 DIAGNOSIS — Z8679 Personal history of other diseases of the circulatory system: Secondary | ICD-10-CM | POA: Diagnosis not present

## 2017-04-02 DIAGNOSIS — M25461 Effusion, right knee: Secondary | ICD-10-CM

## 2017-04-02 DIAGNOSIS — M1A09X0 Idiopathic chronic gout, multiple sites, without tophus (tophi): Secondary | ICD-10-CM

## 2017-04-02 MED ORDER — PREDNISONE 5 MG PO TABS
10.0000 mg | ORAL_TABLET | Freq: Every day | ORAL | 2 refills | Status: DC
Start: 2017-04-02 — End: 2018-08-10

## 2017-04-02 NOTE — Telephone Encounter (Signed)
Spoke to patient about his insurance coverage for Humira. He has [employer] Part D plan. A prior authorization was submitted to insurance via cover my meds. Will update once we receive a response.   We also discussed the possibility of [employer] Patient Assistance. Patient is interested in applying if is co-pay is to high. The application was signed. Will submit once we have a response from the prior authorization. Will contact patient with an update. Patient voiced understanding and denied any questions at this time.   Maizie Garno, [place], CPhT 3:46 PM

## 2017-04-02 NOTE — Patient Instructions (Addendum)
Adalimumab Injection What is this medicine? ADALIMUMAB (a dal AYE mu mab) is used to treat rheumatoid and psoriatic arthritis. It is also used to treat ankylosing spondylitis, Crohn's disease, ulcerative colitis, plaque psoriasis, hidradenitis suppurativa, and uveitis. This medicine may be used for other purposes; ask your health care provider or pharmacist if you have questions. COMMON BRAND NAME(S): CYLTEZO, Humira What should I tell my health care provider before I take this medicine? They need to know if you have any of these conditions: -diabetes -heart disease -hepatitis B or history of hepatitis B infection -immune system problems -infection or history of infections -multiple sclerosis -recently received or scheduled to receive a vaccine -scheduled to have surgery -tuberculosis, a positive skin test for tuberculosis or have recently been in close contact with someone who has tuberculosis -an unusual reaction to adalimumab, other medicines, mannitol, latex, rubber, foods, dyes, or preservatives -pregnant or trying to get pregnant -breast-feeding How should I use this medicine? This medicine is for injection under the skin. You will be taught how to prepare and give this medicine. Use exactly as directed. Take your medicine at regular intervals. Do not take your medicine more often than directed. A special MedGuide will be given to you by the pharmacist with each prescription and refill. Be sure to read this information carefully each time. It is important that you put your used needles and syringes in a special sharps container. Do not put them in a trash can. If you do not have a sharps container, call your pharmacist or healthcare provider to get one. Talk to your pediatrician regarding the use of this medicine in children. While this drug may be prescribed for children as young as 2 years for selected conditions, precautions do apply. The manufacturer of the medicine offers free  information to patients and their health care partners. Call 1-800-448-6472 for more information. Overdosage: If you think you have taken too much of this medicine contact a poison control center or emergency room at once. NOTE: This medicine is only for you. Do not share this medicine with others. What if I miss a dose? If you miss a dose, take it as soon as you can. If it is almost time for your next dose, take only that dose. Do not take double or extra doses. Give the next dose when your next scheduled dose is due. Call your doctor or health care professional if you are not sure how to handle a missed dose. What may interact with this medicine? Do not take this medicine with any of the following medications: -abatacept -anakinra -etanercept -infliximab -live virus vaccines -rilonacept This medicine may also interact with the following medications: -vaccines This list may not describe all possible interactions. Give your health care provider a list of all the medicines, herbs, non-prescription drugs, or dietary supplements you use. Also tell them if you smoke, drink alcohol, or use illegal drugs. Some items may interact with your medicine. What should I watch for while using this medicine? Visit your doctor or health care professional for regular checks on your progress. Tell your doctor or healthcare professional if your symptoms do not start to get better or if they get worse. You will be tested for tuberculosis (TB) before you start this medicine. If your doctor prescribes any medicine for TB, you should start taking the TB medicine before starting this medicine. Make sure to finish the full course of TB medicine. Call your doctor or health care professional if you get a cold   or other infection while receiving this medicine. Do not treat yourself. This medicine may decrease your body's ability to fight infection. Talk to your doctor about your risk of cancer. You may be more at risk for  certain types of cancers if you take this medicine. What side effects may I notice from receiving this medicine? Side effects that you should report to your doctor or health care professional as soon as possible: -allergic reactions like skin rash, itching or hives, swelling of the face, lips, or tongue -breathing problems -changes in vision -chest pain -fever, chills, or any other sign of infection -numbness or tingling -red, scaly patches or raised bumps on the skin -swelling of the ankles -swollen lymph nodes in the neck, underarm, or groin areas -unexplained weight loss -unusual bleeding or bruising -unusually weak or tired Side effects that usually do not require medical attention (report to your doctor or health care professional if they continue or are bothersome): -headache -nausea -redness, itching, swelling, or bruising at site where injected This list may not describe all possible side effects. Call your doctor for medical advice about side effects. You may report side effects to FDA at 1-800-FDA-1088. Where should I keep my medicine? Keep out of the reach of children. Store in the original container and in the refrigerator between 2 and 8 degrees C (36 and 46 degrees F). Do not freeze. The product may be stored in a cool carrier with an ice pack, if needed. Protect from light. Throw away any unused medicine after the expiration date. NOTE: This sheet is a summary. It may not cover all possible information. If you have questions about this medicine, talk to your doctor, pharmacist, or health care provider.  2018 Elsevier/Gold Standard (2015-01-31 11:11:43)  

## 2017-04-06 ENCOUNTER — Telehealth: Payer: Self-pay | Admitting: Rheumatology

## 2017-04-06 ENCOUNTER — Other Ambulatory Visit: Payer: Self-pay | Admitting: Rheumatology

## 2017-04-06 NOTE — Telephone Encounter (Signed)
Patient advised that we encourage the flu vaccination. Patient advised not to get the flu mist as it is a live virus. Patient verbalized understanding.

## 2017-04-06 NOTE — Telephone Encounter (Signed)
Patient would like to know if he can take the Flu Vaccine with the new medication doco=tor wants to put him on. Please call to advise.

## 2017-04-06 NOTE — Telephone Encounter (Signed)
Received a fax from [employer] regarding a prior authorization approval for HUMIRA PEN through 07/27/2017.   Reference number:[id number] Phone [phone]  Will send document to scan center.  Called patient to inform him. Patient's co-pay is $1772.05. We will submit a patient assistance application to [facility]. Patient is interested in apply for [facility] if he is denied patient assistance. Will update once we receive a response.   Patient assistance application is ready to be faxed. Will need the provider portion filled out and signed first. Will you take care of this? Thanks!   Billy Hurley, [place], CPhT 10:33 AM

## 2017-04-06 NOTE — Telephone Encounter (Signed)
Attempted to contact the patient and left message for patient to call the office.  

## 2017-04-06 NOTE — Telephone Encounter (Signed)
Last Visit: 04/02/17 Next Visit: 07/07/17 Labs: 01/30/17 WNL  Okay to refill per Dr. Corliss Skainseveshwar.

## 2017-04-09 ENCOUNTER — Telehealth: Payer: Self-pay

## 2017-04-09 NOTE — Telephone Encounter (Signed)
Received a fax from [employer] stating that they received the pts application. They will complete the review and contact us with results.  Will update once we receive a response.  Clark Clowdus, [place], CPhT 2:51 PM

## 2017-04-09 NOTE — Telephone Encounter (Signed)
[facility] Patient Assistance Application has been faxed. Will update once we receive a response.   Maebel Marasco, [place], CPhT 8:23 AM

## 2017-04-16 NOTE — Telephone Encounter (Signed)
Called to check the status of patients applications. Spoke with Marita [place] who states that the patient is missing his proof of income. Patient gave consent on application for program to use Fair Credit Report for proof of income. Marita [place] states that the foundation has not yet implemented that program and documents would be required in order to process application. A call was made to the patient from the foundation [employer]) and a letter was sent out as well. Information can be faxed to [phone] with pt name and dob.  Called patient to update. Spoke to his wife. She will gather their bank statements and fax them from her church on Friday or Monday. She will call us to let us know when she sends it.   Ruther Ephraim, [place], CPhT 2:06 PM

## 2017-04-17 NOTE — Telephone Encounter (Signed)
Received financial documents from patient. Documents were faxed to foundation. Will update once we receive a response.   Kaedyn Polivka, [place], CPhT 4:19 PM

## 2017-04-17 NOTE — Telephone Encounter (Signed)
Patient called to say that she faxed financial documents to the office. Will fax to foundation once received.   Will update once we have a response.   Patsye Sullivant, [place], CPhT 2:23 PM

## 2017-04-21 NOTE — Telephone Encounter (Signed)
Patient has been scheduled for 04/23/17 @ 10 am. Sample will be used to start patient.

## 2017-04-21 NOTE — Telephone Encounter (Signed)
Received a fax from [employer] stating that patient application for HUMIRA assistance has been approved through 07/27/2017. Foundation will contact him to set up delivery. Patient can contact them at [phone].   Called patient to inform him. He will get his initial dose in office. Gave him the number for the foundation. Pt voices understanding and denies any questions at this time.   Can we use a sample for him? Will need to schedule a nursing visit for initial dose. Thanks!  Ciearra Rufo, [place], CPhT 8:28 AM

## 2017-04-23 ENCOUNTER — Ambulatory Visit (INDEPENDENT_AMBULATORY_CARE_PROVIDER_SITE_OTHER): Payer: Medicare Other | Admitting: *Deleted

## 2017-04-23 VITALS — BP 118/73 | HR 86

## 2017-04-23 DIAGNOSIS — M0609 Rheumatoid arthritis without rheumatoid factor, multiple sites: Secondary | ICD-10-CM | POA: Diagnosis not present

## 2017-04-23 MED ORDER — METHOTREXATE 2.5 MG PO TABS: 20.0000 mg | ORAL_TABLET | ORAL | 0 refills | Status: DC

## 2017-04-23 MED ORDER — HYDROXYCHLOROQUINE SULFATE 200 MG PO TABS: 200.0000 mg | ORAL_TABLET | Freq: Two times a day (BID) | ORAL | 0 refills | Status: DC

## 2017-04-23 MED ORDER — FOLIC ACID 1 MG PO TABS: 2.0000 mg | ORAL_TABLET | Freq: Every day | ORAL | 4 refills | Status: DC

## 2017-04-23 NOTE — Patient Instructions (Signed)
Standing Labs We placed an order today for your standing lab work.    Please come back and get your standing labs in 1 month, 2 months, every 3 months  We have open lab Monday through Friday from 8:30-11:30 AM and 1:30-4 PM at the office of Dr. Pollyann Savoy.   The office is located at [address], [place], [place] [address] No appointment is necessary.   Labs are drawn by [facility].  You may receive a bill from [facility] for your lab work. If you have any questions regarding directions or hours of operation,  please call [phone].

## 2017-04-23 NOTE — Progress Notes (Signed)
Patient in office for a new start to Humira. Patient is not currently on a Biologic. Demonstrated the proper technique for administration of Humira. Patient was able to demonstrate it. Patient was provided with a sample and given injection in his right thigh. Patient tolerated injection well. Patient was monitored in office for 30 minutes after administration for adverse reactions. No adverse reactions noted.   Administrations This Visit    Adalimumab PSKT 40 mg    Admin Date 04/23/2017 Action Given Dose 40 mg Route Subcutaneous Administered By Henriette Combs, LPN

## 2017-04-24 MED ORDER — ADALIMUMAB 40 MG/0.4ML ~~LOC~~ PSKT
40.0000 mg | PREFILLED_SYRINGE | Freq: Once | SUBCUTANEOUS | Status: AC
  Administered 2017-04-23: 40 mg via SUBCUTANEOUS

## 2017-05-04 ENCOUNTER — Telehealth: Payer: Self-pay | Admitting: Rheumatology

## 2017-05-04 NOTE — Telephone Encounter (Signed)
Patient may need to have a root canal, and wants to make sure he is okay to take an antibotic with all the other medication he is currently on.

## 2017-05-05 NOTE — Telephone Encounter (Signed)
Patient is having a root canal done on Thursday. Patient advised he is not going to be given any antibiotics. Patient advised to hold MTX and Humira if they do decide to put him on antibiotics. Patient verbalized understanding.

## 2017-05-25 ENCOUNTER — Telehealth (INDEPENDENT_AMBULATORY_CARE_PROVIDER_SITE_OTHER): Payer: Self-pay

## 2017-05-25 DIAGNOSIS — Z79899 Other long term (current) drug therapy: Secondary | ICD-10-CM

## 2017-05-25 NOTE — Addendum Note (Signed)
Addended by: Henriette CombsHATTON, Tahir Blank L on: 05/25/2017 02:40 PM   Modules accepted: Orders

## 2017-05-25 NOTE — Telephone Encounter (Signed)
Patient would like to know if his labs are due and if so, could order be faxed to office in [place]?  CB# is [phone].  Please advise.  Thank You.

## 2017-05-25 NOTE — Telephone Encounter (Signed)
Labs faxed

## 2017-06-11 ENCOUNTER — Telehealth: Payer: Self-pay | Admitting: Rheumatology

## 2017-06-11 NOTE — Telephone Encounter (Signed)
Patient states lab results were to be sent here from [place]. Patient request results. Please call to advise.

## 2017-06-12 NOTE — Telephone Encounter (Signed)
Patient advised we will call once the labs are scanned into the computer.

## 2017-06-12 NOTE — Telephone Encounter (Signed)
Attempted to contact the patient and no answer.

## 2017-06-16 ENCOUNTER — Telehealth: Payer: Self-pay

## 2017-06-16 NOTE — Telephone Encounter (Signed)
Received a fax from [employer] stating that the pt has been approved to receive HUMIRA at no cost through 07/27/2018.   Phone: [phone]  Will send document to scan center.   Called patient to update. Patient voices understanding and denies any questions at this time.   Metha Kolasa, [place], CPhT 8:47 AM

## 2017-06-25 NOTE — Progress Notes (Signed)
Office Visit Note  Patient: Billy Hurley             Date of Birth: 1935/09/08           MRN: [id number]             PCP: Christena Flake, MD Referring: Christena Flake, MD Visit Date: 07/07/2017 Occupation: @GUAROCC @    Subjective:  Joint stiffness   History of Present Illness: Billy Hurley is a 81 y.o. male with history of rheumatoid arthritis and gout. He states he is not having any joint pain or swelling. He has some joint stiffness. He has done much better on methotrexate and Plaquenil combination. He denies any gout flare.  Activities of Daily Living:  Patient reports morning stiffness for 0 minute   Patient Denies nocturnal pain.  Difficulty dressing/grooming: Denies Difficulty climbing stairs: Reports Difficulty getting out of chair: Reports Difficulty using hands for taps, buttons, cutlery, and/or writing: Denies   Review of Systems  Constitutional: Positive for activity change. Negative for fatigue, night sweats and weakness ( ).  HENT: Negative for mouth sores, mouth dryness and nose dryness.   Eyes: Negative for redness and dryness.  Respiratory: Negative for shortness of breath and difficulty breathing.   Cardiovascular: Positive for swelling in legs/feet. Negative for chest pain, palpitations, hypertension and irregular heartbeat.  Gastrointestinal: Negative for constipation, diarrhea and heartburn.  Endocrine: Negative for excessive thirst and increased urination.  Genitourinary: Negative for difficulty urinating.  Musculoskeletal: Positive for arthralgias and joint pain. Negative for joint swelling, myalgias, muscle weakness, morning stiffness, muscle tenderness and myalgias.  Skin: Negative for color change, rash, hair loss, nodules/bumps, skin tightness, ulcers and sensitivity to sunlight.  Allergic/Immunologic: Negative for susceptible to infections.  Neurological: Negative for dizziness, fainting, numbness, memory loss and night sweats.  Hematological:  Negative for bruising/bleeding tendency and swollen glands.  Psychiatric/Behavioral: Positive for sleep disturbance. Negative for depressed mood. The patient is not nervous/anxious.     PMFS History:  Patient Active Problem List   Diagnosis Date Noted  . Rheumatoid arthritis of multiple sites with negative rheumatoid factor (HCC) 10/28/2016  . High risk medication use 10/28/2016  . Idiopathic chronic gout of multiple sites without tophus 09/17/2016  . Bilateral knee effusions 09/17/2016  . History of gout 08/26/2016  . Primary osteoarthritis of both knees 08/26/2016  . Impingement syndrome of shoulder region 08/26/2016  . Essential hypertension 08/26/2016  . History of hyperglycemia 08/26/2016  . Chronic atrial fibrillation (HCC) 08/26/2016  . Other sleep apnea 08/26/2016    Past Medical History:  Diagnosis Date  . A-fib (HCC)   . Gout   . Hypertension     Family History  Problem Relation Age of Onset  . Stroke Brother   . Hypertension Brother   . Hypertension Brother    Past Surgical History:  Procedure Laterality Date  . APPENDECTOMY    . TOTAL SHOULDER ARTHROPLASTY     Social History   Social History Narrative  . Not on file     Objective: Vital Signs: BP (!) 122/51 (BP Location: Left Arm, Patient Position: Sitting, Cuff Size: Normal)   Pulse 63   Resp 18   Ht 5' 8.5" (1.74 m)   Wt 194 lb (88 kg)   BMI 29.07 kg/m    Physical Exam  Constitutional: He is oriented to person, place, and time. He appears well-developed and well-nourished.  HENT:  Head: Normocephalic and atraumatic.  Eyes: Conjunctivae and EOM are normal. Pupils are equal, round,  and reactive to light.  Neck: Normal range of motion. Neck supple.  Cardiovascular: Normal rate, regular rhythm and normal heart sounds.  Pulmonary/Chest: Effort normal and breath sounds normal.  Abdominal: Soft. Bowel sounds are normal.  Neurological: He is alert and oriented to person, place, and time.  Skin: Skin  is warm and dry. Capillary refill takes less than 2 seconds.  Psychiatric: He has a normal mood and affect. His behavior is normal.  Nursing note and vitals reviewed.    Musculoskeletal Exam: C-spine limited range of motion. Shoulder joints elbow joints wrist joints are good range of motion. He DIP PIP thickening in his hands but no synovitis. He had small effusion in bilateral knee joints with some warmth. He has some pitting edema on his bilateral lower extremity. No synovitis was noted over her ankles MTPs.  CDAI Exam: CDAI Homunculus Exam:   Joint Counts:  CDAI Tender Joint count: 0 CDAI Swollen Joint count: 0  Global Assessments:  Patient Global Assessment: 1 Provider Global Assessment: 1  CDAI Calculated Score: 2    Investigation: No additional findings. CBC, CMP: 05/29/2017 stable Uric acid: 01/30/2017 5.1 CBC Latest Ref Rng & Units 11/17/2016 10/17/2016 08/26/2016  WBC 3.8 - 10.8 K/uL 9.1 8.7 5.9  Hemoglobin 13.2 - 17.1 g/dL 60.613.5 13.0(L) 10.8(L)  Hematocrit 38.5 - 50.0 % 41.2 40.6 32.9(L)  Platelets 140 - 400 K/uL 249 219 362   CMP Latest Ref Rng & Units 11/17/2016 10/17/2016 08/26/2016  Glucose 65 - 99 mg/dL 301(S105(H) 010(X100(H) 91  BUN 7 - 25 mg/dL 20 15 14   Creatinine 0.70 - 1.18 mg/dL 3.230.76 5.57(D0.68(L) 2.20(U0.56(L)  Sodium 135 - 146 mmol/L 140 140 133(L)  Potassium 3.5 - 5.3 mmol/L 3.9 4.0 4.3  Chloride 98 - 110 mmol/L 101 103 98  CO2 20 - 31 mmol/L 26 29 22   Calcium 8.6 - 10.3 mg/dL 9.3 8.9 9.1  Total Protein 6.1 - 8.1 g/dL 7.1 6.4 7.2  Total Bilirubin 0.2 - 1.2 mg/dL 0.6 0.6 0.7  Alkaline Phos 40 - 115 U/L 84 85 148(H)  AST 10 - 35 U/L 22 15 18   ALT 9 - 46 U/L 23 14 13     Imaging: No results found.  Speciality Comments: No specialty comments available.    Procedures:  No procedures performed Allergies: Patient has no known allergies.   Assessment / Plan:     Visit Diagnoses: Rheumatoid arthritis of multiple sites with negative rheumatoid factor (HCC) - patient has  severe rheumatoid arthritis with joint inflammation swelling in multiple joints. He is doing much better on Humira methotrexate and Plaquenil combination. He's been off prednisone now which she took as a ridging therapy. He will come off Plaquenil once he finishes the course.  High risk medication use - Methotrexate 8 tablets by mouth every week, folic acid 2 mg by mouth daily and Plaquenil 200 mg by mouth once a day.  - Plan: CBC with Differential/Platelet, COMPLETE METABOLIC PANEL WITH GFR in March and then every 3 months.  Idiopathic chronic gout of multiple sites without tophus - On allopurinol 100 mg by mouth dailyUric acid: 01/30/2017 5.1 - Plan: Uric acid will be checked in March as well. He has not had any gout flare.  Primary osteoarthritis of both knees: He continues to have some discomfort in his knee joint.  Bilateral knee effusions - minimal bilateral knee joint effusions were noted. I would like to observe at this time and see how he responds to the combination therapy.  Other  medical problems are listed as follows:   History of hypertension  History of hyperglycemia  History of atrial fibrillation - he is on warfarin    Orders: Orders Placed This Encounter  Procedures  . CBC with Differential/Platelet  . COMPLETE METABOLIC PANEL WITH GFR  . Uric acid   Meds ordered this encounter  Medications  . methotrexate (RHEUMATREX) 2.5 MG tablet    Sig: Take 8 tablets (20 mg total) by mouth once a week. Caution:Chemotherapy. Protect from light.    Dispense:  96 tablet    Refill:  0    Face-to-face time spent with patient was 30 minutes. Greater than 50% of time was spent in counseling and coordination of care.  Follow-Up Instructions: Return in about 3 months (around 10/05/2017) for Rheumatoid arthritis, Osteoarthritis.   Pollyann SavoyShaili Baruc Tugwell, MD  Note - This record has been created using [employer] software.  Chart creation errors have been sought, but may not always  have been  located. Such creation errors do not reflect on  the standard of medical care.

## 2017-06-26 ENCOUNTER — Telehealth (INDEPENDENT_AMBULATORY_CARE_PROVIDER_SITE_OTHER): Payer: Self-pay

## 2017-06-26 NOTE — Telephone Encounter (Signed)
Patient was calling concerning his lab results.  Would like a call back.  Cb# is [phone].  Please advise.  Thank you.

## 2017-06-29 ENCOUNTER — Telehealth: Payer: Self-pay | Admitting: Rheumatology

## 2017-06-29 NOTE — Telephone Encounter (Signed)
Patient advised his labs are stable. Patient vebralized understanding.

## 2017-06-29 NOTE — Telephone Encounter (Signed)
See previous office note 

## 2017-06-29 NOTE — Telephone Encounter (Signed)
Patient called wanting to get the results of his lab work.  He is wondering why no one has called him about his results.  CB#[phone].  Thank you.

## 2017-07-07 ENCOUNTER — Encounter: Payer: Self-pay | Admitting: Rheumatology

## 2017-07-07 ENCOUNTER — Ambulatory Visit (INDEPENDENT_AMBULATORY_CARE_PROVIDER_SITE_OTHER): Payer: Medicare Other | Admitting: Rheumatology

## 2017-07-07 VITALS — BP 122/51 | HR 63 | Resp 18 | Ht 68.5 in | Wt 194.0 lb

## 2017-07-07 DIAGNOSIS — M25462 Effusion, left knee: Secondary | ICD-10-CM

## 2017-07-07 DIAGNOSIS — Z8639 Personal history of other endocrine, nutritional and metabolic disease: Secondary | ICD-10-CM | POA: Diagnosis not present

## 2017-07-07 DIAGNOSIS — Z79899 Other long term (current) drug therapy: Secondary | ICD-10-CM

## 2017-07-07 DIAGNOSIS — M0609 Rheumatoid arthritis without rheumatoid factor, multiple sites: Secondary | ICD-10-CM

## 2017-07-07 DIAGNOSIS — M25461 Effusion, right knee: Secondary | ICD-10-CM | POA: Diagnosis not present

## 2017-07-07 DIAGNOSIS — Z8679 Personal history of other diseases of the circulatory system: Secondary | ICD-10-CM | POA: Diagnosis not present

## 2017-07-07 DIAGNOSIS — M170 Bilateral primary osteoarthritis of knee: Secondary | ICD-10-CM | POA: Diagnosis not present

## 2017-07-07 DIAGNOSIS — M1A09X0 Idiopathic chronic gout, multiple sites, without tophus (tophi): Secondary | ICD-10-CM | POA: Diagnosis not present

## 2017-07-07 MED ORDER — METHOTREXATE 2.5 MG PO TABS: 20.0000 mg | ORAL_TABLET | ORAL | 0 refills | Status: DC

## 2017-07-07 NOTE — Patient Instructions (Signed)
Standing Labs We placed an order today for your standing lab work.    Please come back and get your standing labs in February and every 3 months Uric acid with the next lab  We have open lab Monday through Friday from 8:30-11:30 AM and 1:30-4 PM at the office of Dr. Pollyann SavoyShaili Skarlette Lattner.   The office is located at [address], [address], [place], [place] [address] No appointment is necessary.   Labs are drawn by [facility].  You may receive a bill from [facility] for your lab work. If you have any questions regarding directions or hours of operation,  please call [phone].

## 2017-07-15 ENCOUNTER — Telehealth: Payer: Self-pay

## 2017-07-15 MED ORDER — ADALIMUMAB 40 MG/0.4ML ~~LOC~~ AJKT: 40.0000 mg | AUTO-INJECTOR | SUBCUTANEOUS | 0 refills | Status: DC

## 2017-07-15 NOTE — Telephone Encounter (Signed)
Last Visit: 07/07/17 Next Visit: 10/07/17 Labs: 05/29/17 stable TB Gold: 09/29/16 Neg  Okay to refill per Dr. Corliss Skainseveshwar  Prescription faxed to [facility]

## 2017-07-15 NOTE — Telephone Encounter (Signed)
Patient would like a Rx refill for Humira Pen.  Cb# is [phone].  Please advise.  Thank you.

## 2017-07-15 NOTE — Addendum Note (Signed)
Addended by: Henriette CombsHATTON, Neng Albee L on: 07/15/2017 04:36 PM   Modules accepted: Orders

## 2017-08-21 ENCOUNTER — Telehealth: Payer: Self-pay | Admitting: Rheumatology

## 2017-08-21 NOTE — Telephone Encounter (Signed)
Patient advised to take Prednisone 4 tabs for 4 days, 3 tabs for 4 days, 2 tabs for 4 days and 1 tab for 4 days. Patient verbalized understanding.

## 2017-08-21 NOTE — Telephone Encounter (Signed)
Patient states he is having some swelling in his ankles and calves. Patient states he is on Humira and Methotrexate. Patient is leaving to go on vacation for 2 weeks tomorrow. Patient has a bottle of prednisone 5 mg tablets that he had left from tapering off. Patient states he has 60 tablets. Okay to give him a taper schedule?

## 2017-08-21 NOTE — Telephone Encounter (Signed)
Patient called stating that he is currently taking Methotrexate and Humira.  His ankles and calfs are swollen and he wants to know if he can take his "low dose" prednisone.  He is leaving for vacation tomorrow for 2 weeks and wants to make sure it is okay to take the Prednisone for the swelling.  Patient's CB# [phone]

## 2017-08-21 NOTE — Telephone Encounter (Signed)
Okay to give prednisone taper as prescribed earlier

## 2017-09-22 ENCOUNTER — Telehealth: Payer: Self-pay | Admitting: Rheumatology

## 2017-09-22 DIAGNOSIS — Z79899 Other long term (current) drug therapy: Secondary | ICD-10-CM

## 2017-09-22 DIAGNOSIS — M1A09X0 Idiopathic chronic gout, multiple sites, without tophus (tophi): Secondary | ICD-10-CM

## 2017-09-22 NOTE — Telephone Encounter (Signed)
Last Visit: 07/07/17 Next Visit: 10/07/17 Labs: 05/29/17 stable TB Gold: 09/29/16 Neg  Patient advised he is due to update labs. Lab Orders faxed to lab. Patient advised will send prescription to [facility] once we have received the labs. Patient verbalized understanding.

## 2017-09-22 NOTE — Telephone Encounter (Signed)
Patient called for prescription refill of Humira.

## 2017-09-23 NOTE — Progress Notes (Signed)
Office Visit Note  Patient: Billy Hurley             Date of Birth: [birth year]           MRN: [id number]             PCP: Christena Flake, MD Referring: Christena Flake, MD Visit Date: 10/07/2017 Occupation: @GUAROCC @    Subjective:  Neck stiffness    History of Present Illness: Odis Turck is a 82 y.o. male History of Present Illness: Vikas Wegmann is a 82 y.o. male with history of seronegative rheumatoid arthritis, gout and osteoarthritis.  Patient states about a month ago he experienced bilateral ankle swelling.  He states that he took prednisone about 2-3 days, which resolved his ankle pain and swelling.  Patient states he continues to take methotrexate 8 tablets weekly, folic acid 2 mg daily and Humira every 14 days.  He states he has been experiencing fleeting headaches and wonders if it is due to methotrexate. He denies any nodules.  Patient denies any recent gout flares.  He continues to take allopurinol 100 mg daily.  Patient states he will occasionally experience discomfort in his right knee when climbing stairs.  He denies any pain in his left knee.  He denies any joint swelling.  He states he continues to have neck stiffness.  He works on neck stretching exercises on a regular basis.  [facility] states [facility]    Activities of Daily Living:  Patient reports morning stiffness for 0 minutes.   Patient Denies nocturnal pain.  Difficulty dressing/grooming: Denies Difficulty climbing stairs: Reports Difficulty getting out of chair: Reports Difficulty using hands for taps, buttons, cutlery, and/or writing: Denies   Review of Systems  Constitutional: Negative for fatigue and weakness.  HENT: Negative for mouth dryness.   Eyes: Negative for dryness.  Respiratory: Negative for difficulty breathing.   Cardiovascular: Negative for chest pain.  Gastrointestinal: Negative for abdominal pain.  Endocrine: Negative for increased  urination.  Genitourinary: Negative for pelvic pain.  Musculoskeletal: Negative for morning stiffness.  Skin: Negative for redness.  Allergic/Immunologic: Negative for susceptible to infections.  Neurological: Positive for headaches.  Hematological: Positive for bruising/bleeding tendency.  Psychiatric/Behavioral: Negative for confusion.    PMFS History:  Patient Active Problem List   Diagnosis Date Noted  . Rheumatoid arthritis of multiple sites with negative rheumatoid factor (HCC) 10/28/2016  . High risk medication use 10/28/2016  . Idiopathic chronic gout of multiple sites without tophus 09/17/2016  . Bilateral knee effusions 09/17/2016  . History of gout 08/26/2016  . Primary osteoarthritis of both knees 08/26/2016  . Impingement syndrome of shoulder region 08/26/2016  . Essential hypertension 08/26/2016  . History of hyperglycemia 08/26/2016  . Chronic atrial fibrillation (HCC) 08/26/2016  . Other sleep apnea 08/26/2016    Past Medical History:  Diagnosis Date  . A-fib (HCC)   . Gout   . Hypertension     Family History  Problem Relation Age of Onset  . Stroke Brother   . Hypertension Brother   . Hypertension Brother    Past Surgical History:  Procedure Laterality Date  . APPENDECTOMY    . TOTAL SHOULDER ARTHROPLASTY     Social History   Social History Narrative  . Not on file     Objective: Vital Signs: BP 133/74 (BP Location: Left Arm, Patient Position: Sitting, Cuff Size: Normal)   Pulse 78   Resp  17   Ht 5\' 8"  (1.727 m)   Wt 203 lb (92.1 kg)   BMI 30.87 kg/m    Physical Exam  Constitutional: He is oriented to person, place, and time. He appears well-developed and well-nourished.  HENT:  Head: Normocephalic and atraumatic.  Eyes: Conjunctivae and EOM are normal. Pupils are equal, round, and reactive to light.  Neck: Normal range of motion. Neck supple.  Cardiovascular: Normal heart sounds.  Irregularly irregular rate and rhythm     Pulmonary/Chest: Effort normal and breath sounds normal.  Abdominal: Soft. Bowel sounds are normal.  Neurological: He is alert and oriented to person, place, and time.  Skin: Skin is warm and dry. Capillary refill takes less than 2 seconds.  Psychiatric: He has a normal mood and affect. His behavior is normal.  Nursing note and vitals reviewed.    Musculoskeletal Exam: C-spine very limited range of motion. Mild thoracic kyphosis.  Lumbar spine good ROM.  No midline spinal tenderness.  No SI joint tenderness.  Shoulder joints, elbow joints, wrist joints, MCPs, PIPs, and DIPs good ROM with no synovitis. Hip joints, knee joints, ankle joints, MTPs, PIPs, DIPs good ROM with no synovitis.  Ankle synovial thickening.  No warmth or effusion of knee joints.  No tenderness of trochanteric bursa.       CDAI Exam: CDAI Homunculus Exam:   Joint Counts:  CDAI Tender Joint count: 0 CDAI Swollen Joint count: 0  Global Assessments:  Patient Global Assessment: 1 Provider Global Assessment: 1  CDAI Calculated Score: 2    Investigation: No additional findings. CBC Latest Ref Rng & Units 09/28/2017 11/17/2016 10/17/2016  WBC 3.8 - 10.8 Thousand/uL 5.7 9.1 8.7  Hemoglobin 13.2 - 17.1 g/dL 40.914.1 81.113.5 13.0(L)  Hematocrit 38.5 - 50.0 % 40.2 41.2 40.6  Platelets 140 - 400 Thousand/uL 177 249 219   CMP Latest Ref Rng & Units 09/28/2017 11/17/2016 10/17/2016  Glucose 65 - 99 mg/dL 914(N104(H) 829(F105(H) 621(H100(H)  BUN 7 - 25 mg/dL 15 20 15   Creatinine 0.70 - 1.11 mg/dL 0.860.86 5.780.76 4.69(G0.68(L)  Sodium 135 - 146 mmol/L 140 140 140  Potassium 3.5 - 5.3 mmol/L 3.9 3.9 4.0  Chloride 98 - 110 mmol/L 106 101 103  CO2 20 - 32 mmol/L 29 26 29   Calcium 8.6 - 10.3 mg/dL 8.9 9.3 8.9  Total Protein 6.1 - 8.1 g/dL 6.2 7.1 6.4  Total Bilirubin 0.2 - 1.2 mg/dL 0.7 0.6 0.6  Alkaline Phos 40 - 115 U/L - 84 85  AST 10 - 35 U/L 26 22 15   ALT 9 - 46 U/L 12 23 14     Imaging: No results found.  Speciality Comments: No specialty comments  available.    Procedures:  No procedures performed Allergies: Patient has no known allergies.   Assessment / Plan:     Visit Diagnoses: Rheumatoid arthritis of multiple sites with negative rheumatoid factor (HCC): He has no synovitis on exam. He had an episode of ankle swelling at the end of January.  He took 2-3 days of Prednisone, which resolved his symptoms.   He has no joint stiffness in the morning.  He has complete fist formation on exam. He has clinically been doing well on Humira every 14 days,  MTX 8 tablets weekly, and folic acid 2 mg daily.  He will continue on this current treatment regimen. A refill of MTX was sent to the pharmacy.   High risk medication use - Humira every 14 days, MTX 8 tablets weekly, folic  acid 2 mg daily. CBC and CMP were drawn on 09/28/17.  Lab results were faxed to PCP.  TB gold future order was placed.  He will return in June for CBC, CMP, and TB gold to monitor for drug toxicity. due - Plan: QuantiFERON-TB Gold Plus  Idiopathic chronic gout of multiple sites without tophus - He has not had any recent gout flares.  He will continue on Allopurinol 100 mg daily.  His most recent Uric acid was 6.5.  Ideally, his uric acid should be < 6. We will continue to monitor.  He was advised to avoid trigger foods and beer.   Primary osteoarthritis of both knees: He has no warmth or effusion on exam.    Other medical conditions are listed as follows:   Chronic atrial fibrillation (HCC) - on Warfarin  History of hyperglycemia  Impingement syndrome of shoulder region, unspecified laterality  Essential hypertension    Orders: Orders Placed This Encounter  Procedures  . QuantiFERON-TB Gold Plus   Meds ordered this encounter  Medications  . methotrexate (RHEUMATREX) 2.5 MG tablet    Sig: Take 8 tablets (20 mg total) by mouth once a week. Caution:Chemotherapy. Protect from light.    Dispense:  96 tablet    Refill:  0    Face-to-face time spent with patient was  30 minutes.> 50% of time was spent in counseling and coordination of care.  Follow-Up Instructions: Return in about 5 months (around 03/09/2018) for Rheumatoid arthritis.   Gearldine Bienenstock, PA-C  Note - This record has been created using Dragon software.  Chart creation errors have been sought, but may not always  have been located. Such creation errors do not reflect on  the standard of medical care.

## 2017-09-25 ENCOUNTER — Telehealth: Payer: Self-pay | Admitting: Rheumatology

## 2017-09-25 NOTE — Telephone Encounter (Signed)
Patient calling stating Billy Hurley has not received refill for Humira pen. Patient states he spoke with someone about refill on Monday/Tuesday, and was told it would be sent in. Please call patient to let him know when refill is sent in so he can call pharmacy.

## 2017-09-28 NOTE — Telephone Encounter (Signed)
Patient advised that the prescription is at [facility] and the pharmacy team is reviewing and will contact the patient to ship medicine.

## 2017-09-29 LAB — CBC WITH DIFFERENTIAL/PLATELET
BASOS ABS: 40 {cells}/uL (ref 0–200)
Basophils Relative: 0.7 %
EOS PCT: 2.6 %
Eosinophils Absolute: 148 cells/uL (ref 15–500)
HEMATOCRIT: 40.2 % (ref 38.5–50.0)
Hemoglobin: 14.1 g/dL (ref 13.2–17.1)
LYMPHS ABS: 2411 {cells}/uL (ref 850–3900)
MCH: 34.4 pg — ABNORMAL HIGH (ref 27.0–33.0)
MCHC: 35.1 g/dL (ref 32.0–36.0)
MCV: 98 fL (ref 80.0–100.0)
MPV: 11.3 fL (ref 7.5–12.5)
Monocytes Relative: 9.3 %
NEUTROS PCT: 45.1 %
Neutro Abs: 2571 cells/uL (ref 1500–7800)
Platelets: 177 10*3/uL (ref 140–400)
RBC: 4.1 10*6/uL — ABNORMAL LOW (ref 4.20–5.80)
RDW: 14.2 % (ref 11.0–15.0)
Total Lymphocyte: 42.3 %
WBC mixed population: 530 cells/uL (ref 200–950)
WBC: 5.7 10*3/uL (ref 3.8–10.8)

## 2017-09-29 LAB — URIC ACID: Uric Acid, Serum: 6.5 mg/dL (ref 4.0–8.0)

## 2017-09-29 LAB — COMPLETE METABOLIC PANEL WITH GFR
AG RATIO: 1.5 (calc) (ref 1.0–2.5)
ALBUMIN MSPROF: 3.7 g/dL (ref 3.6–5.1)
ALT: 12 U/L (ref 9–46)
AST: 26 U/L (ref 10–35)
Alkaline phosphatase (APISO): 93 U/L (ref 40–115)
BILIRUBIN TOTAL: 0.7 mg/dL (ref 0.2–1.2)
BUN: 15 mg/dL (ref 7–25)
CHLORIDE: 106 mmol/L (ref 98–110)
CO2: 29 mmol/L (ref 20–32)
Calcium: 8.9 mg/dL (ref 8.6–10.3)
Creat: 0.86 mg/dL (ref 0.70–1.11)
GFR, EST AFRICAN AMERICAN: 95 mL/min/{1.73_m2} (ref >=60)
GFR, Est Non African American: 82 mL/min/{1.73_m2} (ref >=60)
GLOBULIN: 2.5 g/dL (ref 1.9–3.7)
GLUCOSE: 104 mg/dL — AB (ref 65–99)
Potassium: 3.9 mmol/L (ref 3.5–5.3)
SODIUM: 140 mmol/L (ref 135–146)
TOTAL PROTEIN: 6.2 g/dL (ref 6.1–8.1)

## 2017-09-29 NOTE — Telephone Encounter (Signed)
Labs are stable.

## 2017-10-07 ENCOUNTER — Ambulatory Visit (INDEPENDENT_AMBULATORY_CARE_PROVIDER_SITE_OTHER): Payer: Medicare Other | Admitting: Physician Assistant

## 2017-10-07 ENCOUNTER — Ambulatory Visit: Payer: Medicare Other | Admitting: Rheumatology

## 2017-10-07 ENCOUNTER — Encounter: Payer: Self-pay | Admitting: Physician Assistant

## 2017-10-07 VITALS — BP 133/74 | HR 78 | Resp 17 | Ht 68.0 in | Wt 203.0 lb

## 2017-10-07 DIAGNOSIS — I10 Essential (primary) hypertension: Secondary | ICD-10-CM | POA: Diagnosis not present

## 2017-10-07 DIAGNOSIS — I482 Chronic atrial fibrillation, unspecified: Secondary | ICD-10-CM

## 2017-10-07 DIAGNOSIS — M170 Bilateral primary osteoarthritis of knee: Secondary | ICD-10-CM | POA: Diagnosis not present

## 2017-10-07 DIAGNOSIS — Z8639 Personal history of other endocrine, nutritional and metabolic disease: Secondary | ICD-10-CM | POA: Diagnosis not present

## 2017-10-07 DIAGNOSIS — M1A09X0 Idiopathic chronic gout, multiple sites, without tophus (tophi): Secondary | ICD-10-CM

## 2017-10-07 DIAGNOSIS — M7540 Impingement syndrome of unspecified shoulder: Secondary | ICD-10-CM | POA: Diagnosis not present

## 2017-10-07 DIAGNOSIS — M0609 Rheumatoid arthritis without rheumatoid factor, multiple sites: Secondary | ICD-10-CM

## 2017-10-07 DIAGNOSIS — Z79899 Other long term (current) drug therapy: Secondary | ICD-10-CM

## 2017-10-07 MED ORDER — METHOTREXATE 2.5 MG PO TABS: 20.0000 mg | ORAL_TABLET | ORAL | 0 refills | Status: DC

## 2017-10-07 NOTE — Patient Instructions (Signed)
Standing Labs We placed an order today for your standing lab work.    Please come back and get your standing labs in June and every 3 months  We have open lab Monday through Friday from 8:30-11:30 AM and 1:30-4 PM at the office of Dr. Shaili Deveshwar.   The office is located at [address], [address], [place], [place] [address] No appointment is necessary.   Labs are drawn by [facility].  You may receive a bill from [facility] for your lab work. If you have any questions regarding directions or hours of operation,  please call [phone].    

## 2017-11-09 ENCOUNTER — Telehealth: Payer: Self-pay | Admitting: Rheumatology

## 2017-11-09 NOTE — Telephone Encounter (Signed)
Last Visit: 10/07/17 Next Visit: 03/09/18 Labs: 09/28/17 Stable TB Gold: 09/29/16  Okay to refill per Dr. Corliss Skainseveshwar

## 2017-11-09 NOTE — Telephone Encounter (Signed)
Prescription faxed to [facility] 

## 2017-11-09 NOTE — Telephone Encounter (Signed)
Patient called requesting prescription refill of Humira.  Patient states he usually receives a three month supply, but only received one month.

## 2017-12-03 IMAGING — DX DG CHEST 2V
2 series · 2 of 2 positions shown · non-contrast
Comparison: None.

CLINICAL DATA: Patient for immunosuppressive therapy. Screening
examination for tuberculosis.

EXAM:
CHEST  2 VIEW

[chest pa]
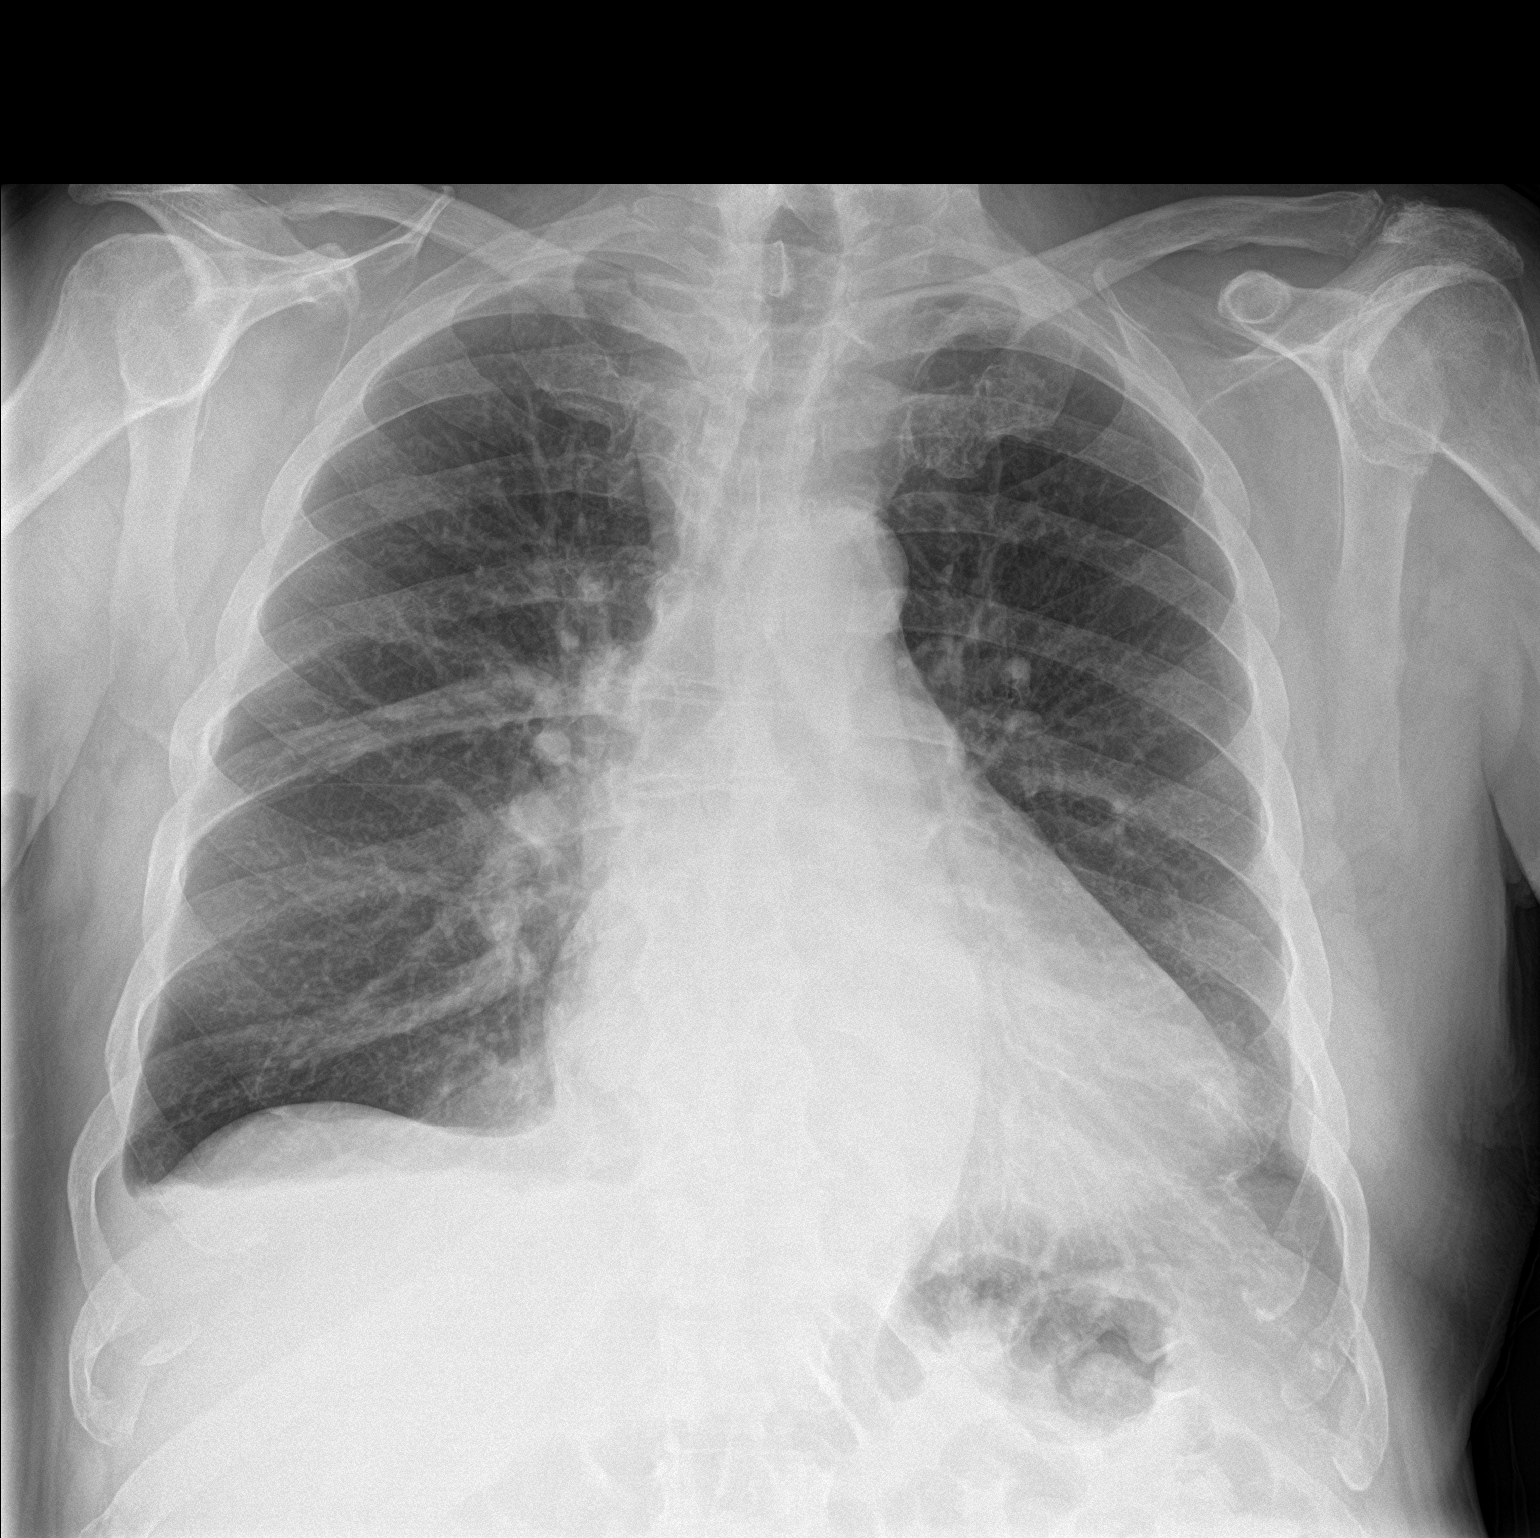

[chest lat]
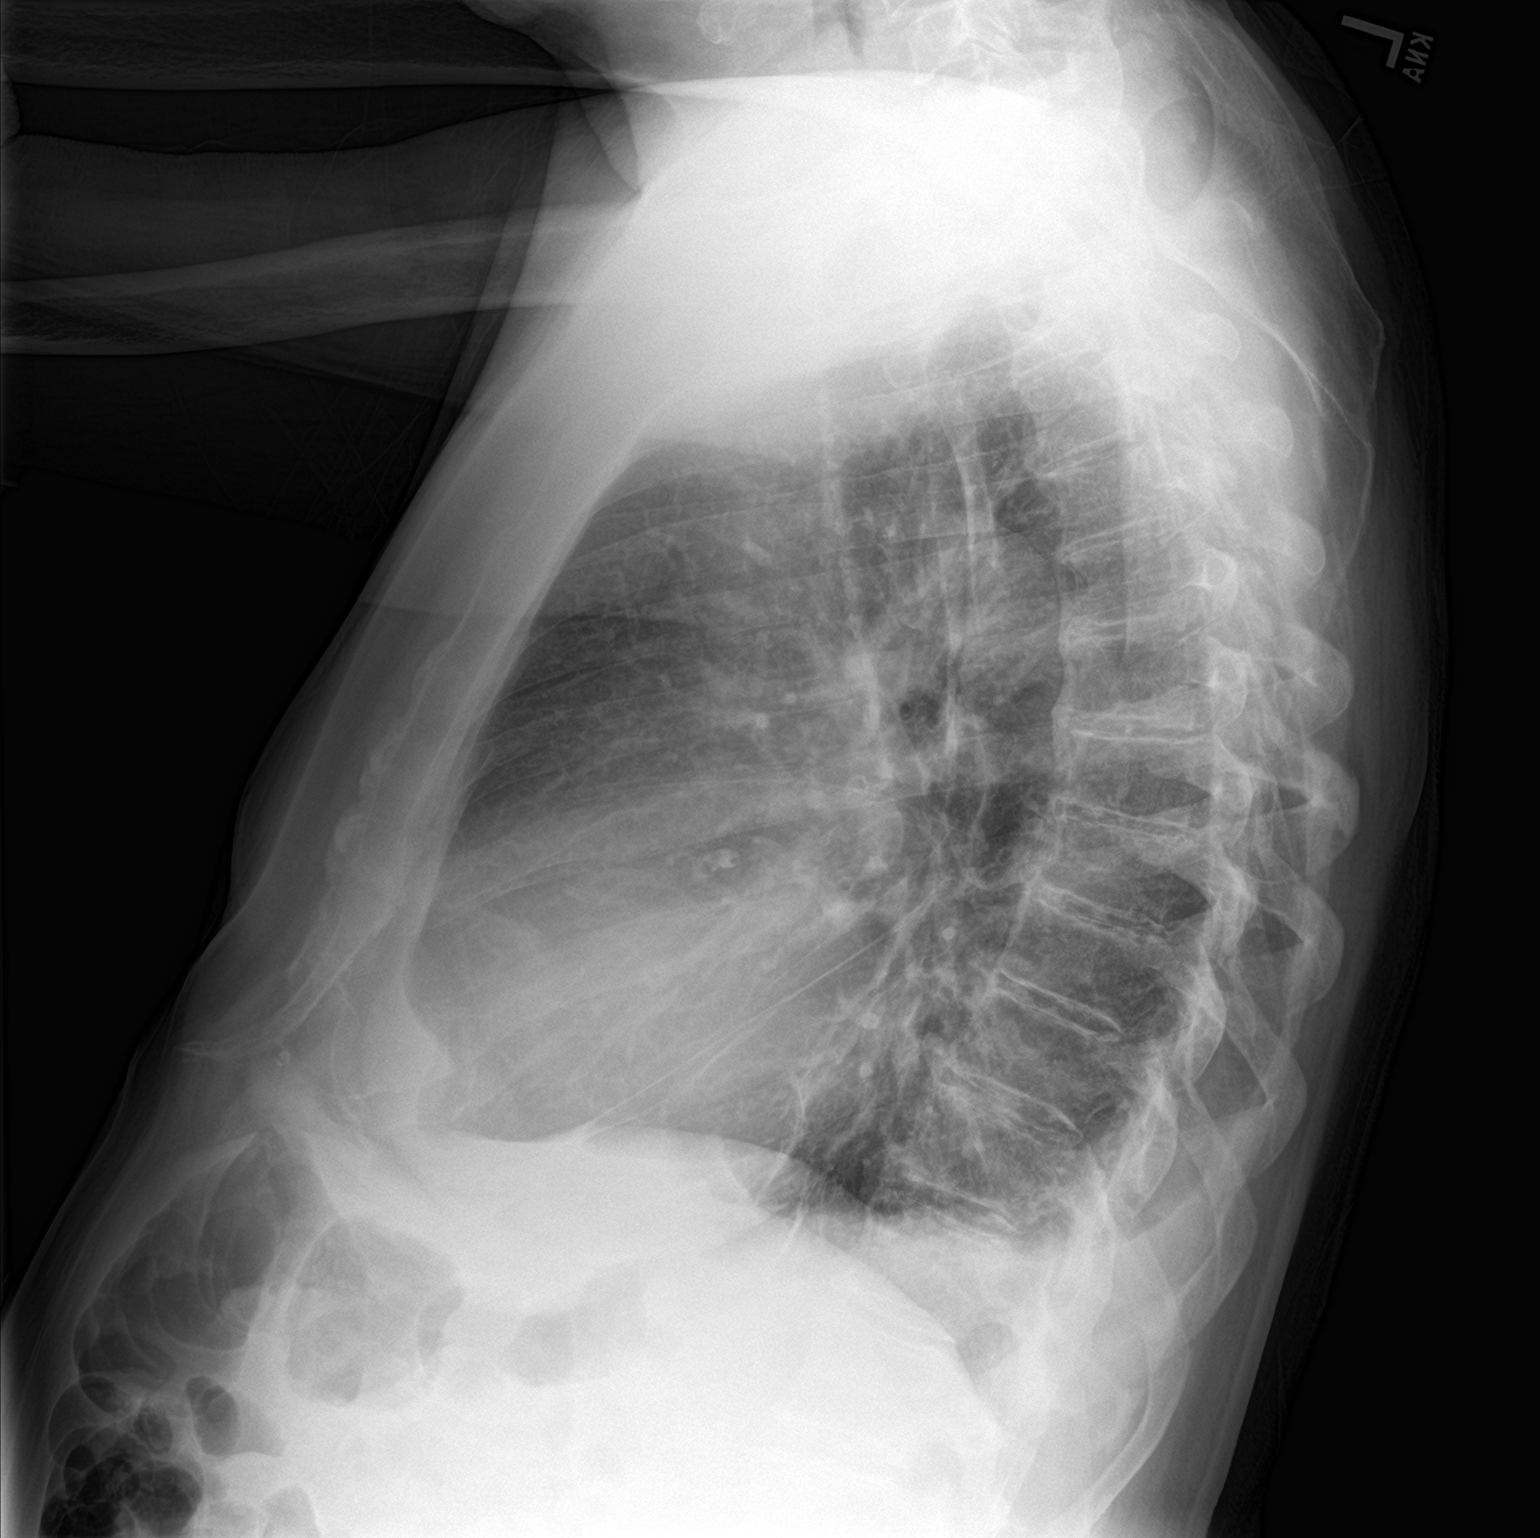

[2 of 2 positions shown; findings below may reference images not displayed]

FINDINGS: There is no evidence of tuberculosis. The patient's very small
scratch the patient has a very small right pleural effusion. Lungs
are clear. There is cardiomegaly. Aortic atherosclerosis noted.
IMPRESSION: Negative for tuberculosis.

Very small right pleural effusion.

Cardiomegaly without edema.

## 2017-12-29 ENCOUNTER — Telehealth: Payer: Self-pay | Admitting: Rheumatology

## 2017-12-29 DIAGNOSIS — Z79899 Other long term (current) drug therapy: Secondary | ICD-10-CM

## 2017-12-29 NOTE — Telephone Encounter (Signed)
Patient called requesting prescription refill of Methotrexate (3 month supply) sent to [facility] [place], [place].

## 2017-12-29 NOTE — Telephone Encounter (Signed)
Last Visit:10/07/17 Next visit: 03/09/18 Labs: 09/28/17 stable  Patient advised he is due for labs. Patient will update labs then will refill prescription for a 90 day supply.

## 2018-01-02 ENCOUNTER — Other Ambulatory Visit: Payer: Self-pay | Admitting: Physician Assistant

## 2018-01-04 NOTE — Telephone Encounter (Addendum)
Last Visit: 10/07/17 Next Visit: 03/09/18 Labs: 09/28/17 stable  Left message to advise patient we need updated labs.   Okay to refill 30 day supply per Dr. Corliss Skainseveshwar

## 2018-01-12 ENCOUNTER — Telehealth: Payer: Self-pay | Admitting: Rheumatology

## 2018-01-12 NOTE — Telephone Encounter (Signed)
Patient called requesting prescription refill of Humira to be sent to the [facility].

## 2018-01-13 NOTE — Telephone Encounter (Signed)
Last Visit: 10/07/17 Next Visit: 03/09/18 Labs: 09/28/17 stable TB Gold: 09/29/16 Neg  Updated labs are on the way to scan center  Okay to refill per Dr. Corliss Skainseveshwar

## 2018-01-25 ENCOUNTER — Telehealth: Payer: Self-pay | Admitting: Rheumatology

## 2018-01-25 NOTE — Telephone Encounter (Signed)
Patient called stating he received his prescription of Methotrexate, but it was only a one month supply and he requested a three month supply.  Patient is also requesting the results of his labwork.

## 2018-01-25 NOTE — Telephone Encounter (Signed)
Patient called stating he called [facility] to have them re-fax the lab results and a representative from [facility] told him that if the office needed another copy then Dr. Fatima Sangereveshwar's office would need to request them.  Their # [phone]

## 2018-01-25 NOTE — Telephone Encounter (Signed)
Patient advised that we have not received his labs. Patient states that he has to go the lab today and will have them fax another copy. Advised patient will send a 90 day supply after we receive the labs.

## 2018-01-26 ENCOUNTER — Other Ambulatory Visit: Payer: Self-pay | Admitting: *Deleted

## 2018-01-26 ENCOUNTER — Telehealth: Payer: Self-pay | Admitting: Rheumatology

## 2018-01-26 DIAGNOSIS — Z79899 Other long term (current) drug therapy: Secondary | ICD-10-CM

## 2018-01-26 NOTE — Telephone Encounter (Signed)
Patient called stating he was returning your call.   

## 2018-01-26 NOTE — Telephone Encounter (Signed)
Attempted to contact the patient and left message for patient to call the office. The only lab draw on that date was a TB Gold. Will need a CBC and CMP.

## 2018-01-26 NOTE — Telephone Encounter (Signed)
Patient advised that the lab received was only a TB Gold and we will need him to go back and get a CBC and CMP. Lab orders faxed and patient will go to have them done.

## 2018-01-27 ENCOUNTER — Telehealth: Payer: Self-pay | Admitting: Rheumatology

## 2018-01-27 MED ORDER — METHOTREXATE 2.5 MG PO TABS: ORAL_TABLET | ORAL | 0 refills | Status: DC

## 2018-01-27 NOTE — Telephone Encounter (Signed)
Patient called stating his PCP told him that they faxed his lab results to our office and he is requesting a return call to receive the results.

## 2018-01-27 NOTE — Telephone Encounter (Signed)
Patient advised we have received lab results and they are normal. Prescription for MTX sent to the pharmacy.   Last Visit: 10/07/17 Next Visit: 03/09/18 Labs: 12/29/17  Okay to refill per Dr. Corliss Skainseveshwar

## 2018-02-19 ENCOUNTER — Telehealth: Payer: Self-pay | Admitting: Pharmacy Technician

## 2018-02-19 NOTE — Telephone Encounter (Signed)
Received renewal application for Humira from [facility]. Completed office portion and called patient to confirm information and advise that we will need income information. Patient has an appointment on 03/09/18, he will bring income info and will sign required forms.   2:09 PM Dorthula Nettlesachael N Lindzy Rupert, CPhT

## 2018-02-23 NOTE — Progress Notes (Signed)
Office Visit Note  Patient: Billy Hurley             Date of Birth: 1935/10/13           MRN: [id number]             PCP: Christena Flake, MD Referring: Christena Flake, MD Visit Date: 03/09/2018 Occupation: @GUAROCC @  Subjective:  Neck pain and stiffness  History of Present Illness: Treven Holtman is a 82 y.o. male with history of seronegative rheumatoid arthritis, osteoarthritis, and gout.  Patient is injecting Humira every 14 days and taking methotrexate 8 tablets by mouth once weekly and folic acid 2 mg daily.  He has been tolerating medications well.  For management of gout he is on allopurinol 100 mg by mouth daily.  He denies any recent gout flares.  He denies any joint pain or joint swelling at this time. He has chronic neck pain and neck stiffness.   Patient reports that several weeks ago he noticed testicular swelling.  He states it feels spongy.  He denies any pain at this time. He reports he has any appointment with PCP in the morning.  He denies having a hernia in the past.   Activities of Daily Living:  Patient reports morning stiffness for 3 minutes.   Patient Denies nocturnal pain.  Difficulty dressing/grooming: Denies Difficulty climbing stairs: Reports Difficulty getting out of chair: Reports Difficulty using hands for taps, buttons, cutlery, and/or writing: Denies  Review of Systems  Constitutional: Negative for fatigue and night sweats.  HENT: Negative for mouth sores, trouble swallowing, trouble swallowing, mouth dryness and nose dryness.   Eyes: Negative for redness, visual disturbance and dryness.  Respiratory: Negative for cough, hemoptysis, shortness of breath and difficulty breathing.   Cardiovascular: Negative for chest pain, palpitations, hypertension, irregular heartbeat and swelling in legs/feet.  Gastrointestinal: Negative for blood in stool, constipation and diarrhea.  Endocrine: Negative for excessive thirst and increased urination.  Genitourinary:  Negative for difficulty urinating and painful urination.  Musculoskeletal: Positive for arthralgias and joint pain. Negative for joint swelling, myalgias, muscle weakness, morning stiffness, muscle tenderness and myalgias.  Skin: Negative for color change, rash, hair loss, nodules/bumps, skin tightness, ulcers and sensitivity to sunlight.  Allergic/Immunologic: Negative for susceptible to infections.  Neurological: Negative for dizziness, fainting, memory loss, night sweats and weakness.  Hematological: Negative for bruising/bleeding tendency and swollen glands.  Psychiatric/Behavioral: Negative for depressed mood and sleep disturbance. The patient is not nervous/anxious.     PMFS History:  Patient Active Problem List   Diagnosis Date Noted  . Rheumatoid arthritis of multiple sites with negative rheumatoid factor (HCC) 10/28/2016  . High risk medication use 10/28/2016  . Idiopathic chronic gout of multiple sites without tophus 09/17/2016  . Bilateral knee effusions 09/17/2016  . History of gout 08/26/2016  . Primary osteoarthritis of both knees 08/26/2016  . Impingement syndrome of shoulder region 08/26/2016  . Essential hypertension 08/26/2016  . History of hyperglycemia 08/26/2016  . Chronic atrial fibrillation (HCC) 08/26/2016  . Other sleep apnea 08/26/2016    Past Medical History:  Diagnosis Date  . A-fib (HCC)   . Gout   . Hypertension     Family History  Problem Relation Age of Onset  . Stroke Brother   . Hypertension Brother   . Hypertension Brother    Past Surgical History:  Procedure Laterality Date  . APPENDECTOMY    . TOTAL SHOULDER ARTHROPLASTY     Social History   Social History Narrative  .  Not on file    Objective: Vital Signs: BP 124/71 (BP Location: Left Arm, Patient Position: Sitting, Cuff Size: Normal)   Pulse (!) 57   Resp 16   Ht 5\' 9"  (1.753 m)   Wt 196 lb 3.2 oz (89 kg)   BMI 28.97 kg/m    Physical Exam  Constitutional: He is oriented  to person, place, and time. He appears well-developed and well-nourished.  HENT:  Head: Normocephalic and atraumatic.  Eyes: Pupils are equal, round, and reactive to light. Conjunctivae and EOM are normal.  Neck: Normal range of motion. Neck supple.  Cardiovascular: Normal rate, regular rhythm and normal heart sounds.  Pulmonary/Chest: Effort normal and breath sounds normal.  Abdominal: Soft. Bowel sounds are normal.  Lymphadenopathy:    He has no cervical adenopathy.  Neurological: He is alert and oriented to person, place, and time.  Skin: Skin is warm and dry. Capillary refill takes less than 2 seconds.  Psychiatric: He has a normal mood and affect. His behavior is normal.  Nursing note and vitals reviewed.    Musculoskeletal Exam: C-spine very limited ROM.  Thoracic kyphosis.  Shoulder joints, elbow joints, wrist joints, MCPs, PIPs, and DIPs good ROM with no synovitis.  PIP and DIP synovial thickening consistent with osteoarthritis of both hands.  Hip joints, knee joints, ankle joints, MTPs, PIPs, and DIPs good ROM with no synovitis.  No warmth or effusion of knee joints.  No ankle tenderness.    CDAI Exam: CDAI Homunculus Exam:   Joint Counts:  CDAI Tender Joint count: 0 CDAI Swollen Joint count: 0  Global Assessments:  Patient Global Assessment: 1 Provider Global Assessment: 1  CDAI Calculated Score: 2   Investigation: No additional findings.  Imaging: Xr Cervical Spine 2 Or 3 Views  Result Date: 03/09/2018 Spondylosis of C-spine noted. C5-C6 and C6-C7 disc space narrowing. 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    Recent Labs: Lab Results  Component Value Date   WBC 5.7 09/28/2017   HGB 14.1 09/28/2017   PLT 177 09/28/2017   NA 140 09/28/2017   K 3.9 09/28/2017   CL 106 09/28/2017   CO2 29 09/28/2017   GLUCOSE 104 (H) 09/28/2017   BUN 15 09/28/2017   CREATININE 0.86 09/28/2017   BILITOT 0.7 09/28/2017   ALKPHOS 84 11/17/2016   AST  26 09/28/2017   ALT 12 09/28/2017   PROT 6.2 09/28/2017   ALBUMIN 3.8 11/17/2016   CALCIUM 8.9 09/28/2017   GFRAA 95 09/28/2017    Speciality Comments: No specialty comments available.  Procedures:  No procedures performed Allergies: Patient has no known allergies.   Assessment / Plan:     Visit Diagnoses: Rheumatoid arthritis of multiple sites with negative rheumatoid factor (HCC): He has no synovitis on exam.  He has no joint pain or joint swelling at this time.  He has not had any recent rheumatoid arthritis flares.  He has chronic neck pain and stiffness.  X-ray was obtained today that revealed disc space narrowing between C5-C6 and C6-C7.  He was given a handout of neck exercises that he can perform at home.  Patient reports that several weeks ago he developed testicular swelling.  He was advised to follow-up with PCP as well as urologist.  He has an appointment with his PCP in the morning.  Infection needs to be ruled out since he is on Humira and methotrexate.  If there is infection he was advised to hold Humira and methotrexate until  the infection resolves.  He is clinically doing well on Humira 40 mg of cutaneous injections every 14 days and methotrexate tablets by mouth once weekly.  He does not need any refills at this time.  He was advised to notify us if he develops increased joint pain or joint swelling.  High risk medication use - Humira every 14 days, MTX 8 tablets weekly, folic acid 2 mg daily.  CBC and CMP were drawn on 12/29/2017.  TB gold negative on 12/29/2017.  Idiopathic chronic gout of multiple sites without tophus -he has not had any recent gout flares.  Uric acid level 6.5 on 09/28/2017.  He will continue taking allopurinol 100 mg by mouth daily.  He does not need any refills at this time.   Neck pain - He has chronic neck pain and neck stiffness. He has very limited ROM of the C-spine. C-spine x-ray was obtained today.  XR revealed C5-C6 and C6-C7 disc space narrowing. A  handout of neck exercises was provided to the patient.  He has no symptoms of radiculopathy at this time.  If he develops radiculopathy, we will order MRI. Plan: XR Cervical Spine 2 or 3 views   Primary osteoarthritis of both knees: No warmth or effusion of knee joints.  Good ROM with no discomfort.    Other medical conditions are listed as follows:   Chronic atrial fibrillation (HCC) - on Warfarin  Essential hypertension  History of hyperglycemia  Impingement syndrome of shoulder region, unspecified laterality    Orders: Orders Placed This Encounter  Procedures  . XR Cervical Spine 2 or 3 views   No orders of the defined types were placed in this encounter.   Face-to-face time spent with patient was 30 minutes. Greater than 50% of time was spent in counseling and coordination of care.  Follow-Up Instructions: Return in about 5 months (around 08/09/2018) for Rheumatoid arthritis, Gout, Osteoarthritis.   Gearldine Bienenstockaylor M Dale, PA-C  I examined and evaluated the patient with Sherron Alesaylor Dale PA.  Patient had no synovitis on my examination.  He denies any gout flare.  His arthritis seems to be quite well controlled on Humira and methotrexate.  He is concerned about the testicular swelling.  He has appointment with his PCP tomorrow.  He also has a schedule an appointment with the urologist on Friday.  I have advised him to hold off methotrexate and Humira injection until his appointment with urologist on Friday.  There is no infection then he can resume his medications.  The plan of care was discussed as noted above.  Pollyann SavoyShaili Deveshwar, MD  Note - This record has been created using Animal nutritionistDragon software.  Chart creation errors have been sought, but may not always  have been located. Such creation errors do not reflect on  the standard of medical care.

## 2018-02-24 NOTE — Progress Notes (Signed)
TB gold negative

## 2018-03-08 NOTE — Telephone Encounter (Signed)
Left message for patient, to remind him to bring income information for [facility] Assistance renewal to his appointment tomorrow.  10:33 AM Dorthula Nettlesachael N Kiril Hippe, CPhT

## 2018-03-09 ENCOUNTER — Ambulatory Visit (INDEPENDENT_AMBULATORY_CARE_PROVIDER_SITE_OTHER): Payer: Medicare Other | Admitting: Rheumatology

## 2018-03-09 ENCOUNTER — Telehealth: Payer: Self-pay | Admitting: Pharmacy Technician

## 2018-03-09 ENCOUNTER — Ambulatory Visit (INDEPENDENT_AMBULATORY_CARE_PROVIDER_SITE_OTHER): Payer: Medicare Other

## 2018-03-09 ENCOUNTER — Encounter: Payer: Self-pay | Admitting: Rheumatology

## 2018-03-09 VITALS — BP 124/71 | HR 57 | Resp 16 | Ht 69.0 in | Wt 196.2 lb

## 2018-03-09 DIAGNOSIS — I10 Essential (primary) hypertension: Secondary | ICD-10-CM

## 2018-03-09 DIAGNOSIS — M1A09X0 Idiopathic chronic gout, multiple sites, without tophus (tophi): Secondary | ICD-10-CM

## 2018-03-09 DIAGNOSIS — M7540 Impingement syndrome of unspecified shoulder: Secondary | ICD-10-CM

## 2018-03-09 DIAGNOSIS — M0609 Rheumatoid arthritis without rheumatoid factor, multiple sites: Secondary | ICD-10-CM

## 2018-03-09 DIAGNOSIS — M542 Cervicalgia: Secondary | ICD-10-CM

## 2018-03-09 DIAGNOSIS — Z79899 Other long term (current) drug therapy: Secondary | ICD-10-CM

## 2018-03-09 DIAGNOSIS — I482 Chronic atrial fibrillation, unspecified: Secondary | ICD-10-CM

## 2018-03-09 DIAGNOSIS — M5030 Other cervical disc degeneration, unspecified cervical region: Secondary | ICD-10-CM

## 2018-03-09 DIAGNOSIS — Z8639 Personal history of other endocrine, nutritional and metabolic disease: Secondary | ICD-10-CM

## 2018-03-09 DIAGNOSIS — M170 Bilateral primary osteoarthritis of knee: Secondary | ICD-10-CM

## 2018-03-09 NOTE — Telephone Encounter (Signed)
Patient came in for appointment today. He signed patient assistance forms and provided income information. Faxed renewal application to [facility].  Fax# [phone]  2:45 PM Dorthula Nettlesachael N Carmine Carrozza, CPhT

## 2018-03-09 NOTE — Patient Instructions (Addendum)
Standing Labs We placed an order today for your standing lab work.    Please come back and get your standing labs in September and every 3 months  We have open lab Monday through Friday from 8:30-11:30 AM and 1:30-4:00 PM  at the office of Dr. Pollyann SavoyShaili Deveshwar.   You may experience shorter wait times on Monday and Friday afternoons. The office is located at [address], [address], [place], [place] [address] No appointment is necessary.   Labs are drawn by [facility].  You may receive a bill from [facility] for your lab work. If you have any questions regarding directions or hours of operation,  please call [phone].     Neck Exercises Neck exercises can be important for many reasons:  They can help you to improve and maintain flexibility in your neck. This can be especially important as you age.  They can help to make your neck stronger. This can make movement easier.  They can reduce or prevent neck pain.  They may help your upper back.  Ask your health care provider which neck exercises would be best for you. Exercises Neck Press Repeat this exercise 10 times. Do it first thing in the morning and right before bed or as told by your health care provider. 1. Lie on your back on a firm bed or on the floor with a pillow under your head. 2. Use your neck muscles to push your head down on the pillow and straighten your spine. 3. Hold the position as well as you can. Keep your head facing up and your chin tucked. 4. Slowly count to 5 while holding this position. 5. Relax for a few seconds. Then repeat.  Isometric Strengthening Do a full set of these exercises 2 times a day or as told by your health care provider. 1. Sit in a supportive chair and place your hand on your forehead. 2. Push forward with your head and neck while pushing back with your hand. Hold for 10 seconds. 3. Relax. Then repeat the exercise 3 times. 4. Next, do thesequence again, this time putting your hand  against the back of your head. Use your head and neck to push backward against the hand pressure. 5. Finally, do the same exercise on either side of your head, pushing sideways against the pressure of your hand.  Prone Head Lifts Repeat this exercise 5 times. Do this 2 times a day or as told by your health care provider. 1. Lie face-down, resting on your elbows so that your chest and upper back are raised. 2. Start with your head facing downward, near your chest. Position your chin either on or near your chest. 3. Slowly lift your head upward. Lift until you are looking straight ahead. Then continue lifting your head as far back as you can stretch. 4. Hold your head up for 5 seconds. Then slowly lower it to your starting position.  Supine Head Lifts Repeat this exercise 8-10 times. Do this 2 times a day or as told by your health care provider. 1. Lie on your back, bending your knees to point to the ceiling and keeping your feet flat on the floor. 2. Lift your head slowly off the floor, raising your chin toward your chest. 3. Hold for 5 seconds. 4. Relax and repeat.  Scapular Retraction Repeat this exercise 5 times. Do this 2 times a day or as told by your health care provider. 1. Stand with your arms at your sides. Look straight ahead.  2. Slowly pull both shoulders backward and downward until you feel a stretch between your shoulder blades in your upper back. 3. Hold for 10-30 seconds. 4. Relax and repeat.  Contact a health care provider if:  Your neck pain or discomfort gets much worse when you do an exercise.  Your neck pain or discomfort does not improve within 2 hours after you exercise. If you have any of these problems, stop exercising right away. Do not do the exercises again unless your health care provider says that you can. Get help right away if:  You develop sudden, severe neck pain. If this happens, stop exercising right away. Do not do the exercises again unless your  health care provider says that you can. Exercises Neck Stretch  Repeat this exercise 3-5 times. 1. Do this exercise while standing or while sitting in a chair. 2. Place your feet flat on the floor, shoulder-width apart. 3. Slowly turn your head to the right. Turn it all the way to the right so you can look over your right shoulder. Do not tilt or tip your head. 4. Hold this position for 10-30 seconds. 5. Slowly turn your head to the left, to look over your left shoulder. 6. Hold this position for 10-30 seconds.  Neck Retraction Repeat this exercise 8-10 times. Do this 3-4 times a day or as told by your health care provider. 1. Do this exercise while standing or while sitting in a sturdy chair. 2. Look straight ahead. Do not bend your neck. 3. Use your fingers to push your chin backward. Do not bend your neck for this movement. Continue to face straight ahead. If you are doing the exercise properly, you will feel a slight sensation in your throat and a stretch at the back of your neck. 4. Hold the stretch for 1-2 seconds. Relax and repeat.  This information is not intended to replace advice given to you by your health care provider. Make sure you discuss any questions you have with your health care provider. Document Released: 06/25/2015 Document Revised: 12/20/2015 Document Reviewed: 01/22/2015 Elsevier Interactive Patient Education  [employer]2018 [employer]vier Inc.

## 2018-03-11 NOTE — Telephone Encounter (Signed)
Received Denial for [facility] due to not meeting eligibilty. Called [employer], Per Rep Tameka, patient was sent an out of pocket expense form that needs to be returned to determine eligibilty. Called patient, he will look for it or call me back to request another form sent out.  Will send document to scan center.  Phone#[phone]  10:24 AM Dorthula Nettlesachael N Kalimah Capurro, CPhT

## 2018-03-15 ENCOUNTER — Other Ambulatory Visit: Payer: Self-pay | Admitting: Pharmacist

## 2018-03-15 ENCOUNTER — Telehealth: Payer: Self-pay | Admitting: Rheumatology

## 2018-03-15 NOTE — Telephone Encounter (Signed)
Patients wife calling in reference to denial letter they received in the mail today from insurance company in regards to Humira. Patients wife request a call back today if possible.

## 2018-03-15 NOTE — Telephone Encounter (Signed)
Called Mrs. Billy Hurley to inform her that the letter she received was for the original denial Rachael informed them about.  Let her know that I faxed the extra information [employer] requested and his case is still under review.  She verbalized understanding.  We will update when we hear a response from [employer].

## 2018-03-15 NOTE — Progress Notes (Signed)
Patient brought in paperwork during wife's appointment.  Called [employer] to verify documentation needed.  Sent the out of pocket expense need for eligibility determination.  Will update when we receive a response.

## 2018-03-16 NOTE — Telephone Encounter (Signed)
Spoke to [place] at [employer], out of pocket expense from have been received and application is under review. Awaiting response.  Spoke to Wife/Patient to advise, will follow up once we receive a response.  11:08 AM Dorthula Nettlesachael N Gorge Almanza, CPhT

## 2018-03-18 NOTE — Telephone Encounter (Signed)
Received approval for [facility] Humira. Patient has been approved for assistance through 07/28/2019. Called patient's home, spoke to wife. No questions or concerns at this time.  Will send document to scan center.  Phone# [phone]  9:54 AM Dorthula Nettlesachael N Neysha Criado, CPhT

## 2018-04-19 ENCOUNTER — Telehealth: Payer: Self-pay | Admitting: Rheumatology

## 2018-04-19 DIAGNOSIS — Z79899 Other long term (current) drug therapy: Secondary | ICD-10-CM

## 2018-04-19 MED ORDER — METHOTREXATE 2.5 MG PO TABS: ORAL_TABLET | ORAL | 0 refills | Status: DC

## 2018-04-19 NOTE — Telephone Encounter (Signed)
[place] for labs. Dr. Merlyn Lotarilion. fax # [phone]. Please fax lab orders. Patient will be out of Humira after Saturday, please send in refill to [employer]. MTX patient still has two doses. Patient question if refill can be sent in yet.

## 2018-04-19 NOTE — Telephone Encounter (Signed)
Lab Orders faxed.   Last Visit: 03/09/18 Next Visit: 08/10/18 Labs: 12/29/17 WNL TB Gold: 12/29/17 Neg   Okay to refill per Dr. Corliss Skainseveshwar

## 2018-04-23 ENCOUNTER — Other Ambulatory Visit: Payer: Self-pay | Admitting: Rheumatology

## 2018-04-23 NOTE — Telephone Encounter (Signed)
Last Visit 03/09/18 Next Visit: 08/10/18 Labs: 12/29/17 WNL  Patient advised we need updated labs. Patient states he had labs updated this week. Patient will call to have results faxed to our office. Patient just picked upa 30 day supply of MTX. Will wait until labs have been received to refill and will send 90 day supply. Patient states he is also taking his last Humira. Patient advised will also send Humira prescription after labs are received.

## 2018-04-26 ENCOUNTER — Telehealth: Payer: Self-pay | Admitting: Rheumatology

## 2018-04-26 NOTE — Telephone Encounter (Signed)
Patient called requesting prescription refill of Humira.  Patient states he is out of medication.  Patient states he had his labwork at [facility] in [place] and was told they will fax the results to Dr. Fatima Sanger office.

## 2018-04-27 NOTE — Telephone Encounter (Signed)
Last Visit: 03/09/18 Next Visit:08/10/18 Labs: 04/21/18 stable TB Gold: 12/29/17   Okay to refill Humira per Dr. Corliss Skains

## 2018-05-04 ENCOUNTER — Other Ambulatory Visit: Payer: Self-pay | Admitting: Rheumatology

## 2018-05-04 NOTE — Telephone Encounter (Signed)
Last Visit 03/09/18 Next Visit: 08/10/18  Okay to refill per Dr. Deveshwar 

## 2018-05-05 ENCOUNTER — Other Ambulatory Visit: Payer: Self-pay | Admitting: Rheumatology

## 2018-05-27 ENCOUNTER — Telehealth: Payer: Self-pay | Admitting: Rheumatology

## 2018-05-27 MED ORDER — METHOTREXATE 2.5 MG PO TABS: ORAL_TABLET | ORAL | 0 refills | Status: DC

## 2018-05-27 NOTE — Telephone Encounter (Signed)
Last Visit: 03/09/18 Next Visit:08/10/18 Labs: 04/21/18 stable  Okay to refill per Dr. Corliss Skains

## 2018-05-27 NOTE — Telephone Encounter (Signed)
Patient called requesting prescription refill of Methotrexate.  Patient states he was only given a 30 day supply and usually receives a 90 day supply.  Patient states he had his labwork done last month with Dr. Tanda Rockers in [place].  Patient is requesting a return call to let him know you received the labwork results.

## 2018-07-06 ENCOUNTER — Telehealth: Payer: Self-pay | Admitting: Rheumatology

## 2018-07-06 DIAGNOSIS — Z79899 Other long term (current) drug therapy: Secondary | ICD-10-CM

## 2018-07-06 NOTE — Telephone Encounter (Signed)
Lab orders have been faxed

## 2018-07-06 NOTE — Telephone Encounter (Signed)
Patient wants to go for labwork on Monday 07/12/2018 at Dr. Chrissie NoaWilliam Zimmer's office. Please send over orders. Patient states last results never got to us last time, we had to call to get them. Please make sure we give a telephone number, or mailing address to get results, per patient. Patient requests a call once orders are sent, so he knows when he can go.

## 2018-07-06 NOTE — Telephone Encounter (Signed)
Please see previous message

## 2018-07-15 ENCOUNTER — Telehealth: Payer: Self-pay | Admitting: Rheumatology

## 2018-07-15 NOTE — Telephone Encounter (Signed)
Patient called requesting a return call after Dr. Corliss Skainseveshwar reviews his labwork results.  Patient states he has one injection left for this Saturday.

## 2018-07-16 MED ORDER — ADALIMUMAB 40 MG/0.4ML ~~LOC~~ AJKT: 40.0000 mg | AUTO-INJECTOR | SUBCUTANEOUS | 0 refills | Status: DC

## 2018-07-16 NOTE — Telephone Encounter (Signed)
Patient advised we are received lab results drawn 07/12/18. Lab results are stable.  Last visit: 03/09/18 Next Visit: 08/10/18 Labs: 07/12/18 stable TB Gold: 12/29/17 Neg   Okay to refill per Dr. Corliss Skainseveshwar   Prescription [facility]ed to [facility]

## 2018-07-27 NOTE — Progress Notes (Signed)
Office Visit Note  Patient: Billy Hurley             Date of Birth: 01/16/1936           MRN: [id number]             PCP: Christena FlakeZimmer, [name], MD Referring: Christena FlakeZimmer, [name], MD Visit Date: 08/10/2018 Occupation: @GUAROCC @  Subjective:  Neck stiffness.    History of Present Illness: Billy Hurley is a 82 y.o. male with history of seronegative rheumatoid arthritis gout and osteoarthritis.  He states he is doing quite well on combination of methotrexate and Humira.  He has not had any joint pain or joint swelling.  He states last flare of rheumatoid arthritis was about 6 months ago.  He has not had gout flare in many years.  He continues to have some stiffness in his joints due to osteoarthritis.  Been experiencing some stiffness in his cervical spine.  Activities of Daily Living:  Patient reports morning stiffness for 0 minute.   Patient Denies nocturnal pain.  Difficulty dressing/grooming: Denies Difficulty climbing stairs: Denies Difficulty getting out of chair: Denies Difficulty using hands for taps, buttons, cutlery, and/or writing: Denies  Review of Systems  Constitutional: Negative for fatigue and night sweats.  HENT: Negative for mouth sores, mouth dryness and nose dryness.   Eyes: Negative for redness and dryness.  Respiratory: Negative for shortness of breath and difficulty breathing.   Cardiovascular: Negative for chest pain, palpitations, hypertension, irregular heartbeat and swelling in legs/feet.  Gastrointestinal: Negative for constipation and diarrhea.  Endocrine: Negative for increased urination.  Musculoskeletal: Negative for arthralgias, joint pain, joint swelling, myalgias, muscle weakness, morning stiffness, muscle tenderness and myalgias.  Skin: Negative for color change, rash, hair loss, nodules/bumps, skin tightness, ulcers and sensitivity to sunlight.  Allergic/Immunologic: Negative for susceptible to infections.  Neurological: Negative for dizziness, fainting,  memory loss, night sweats and weakness ( ).  Hematological: Negative for swollen glands.  Psychiatric/Behavioral: Negative for depressed mood and sleep disturbance. The patient is not nervous/anxious.     PMFS History:  Patient Active Problem List   Diagnosis Date Noted  . Rheumatoid arthritis of multiple sites with negative rheumatoid factor (HCC) 10/28/2016  . High risk medication use 10/28/2016  . Idiopathic chronic gout of multiple sites without tophus 09/17/2016  . Bilateral knee effusions 09/17/2016  . History of gout 08/26/2016  . Primary osteoarthritis of both knees 08/26/2016  . Impingement syndrome of shoulder region 08/26/2016  . Essential hypertension 08/26/2016  . History of hyperglycemia 08/26/2016  . Chronic atrial fibrillation 08/26/2016  . Other sleep apnea 08/26/2016    Past Medical History:  Diagnosis Date  . A-fib (HCC)   . Gout   . Hypertension     Family History  Problem Relation Age of Onset  . Stroke Brother   . Hypertension Brother   . Hypertension Brother    Past Surgical History:  Procedure Laterality Date  . APPENDECTOMY    . TOTAL SHOULDER ARTHROPLASTY     Social History   Social History Narrative  . Not on file    Objective: Vital Signs: BP 126/67 (BP Location: Left Arm, Patient Position: Sitting, Cuff Size: Normal)   Pulse 69   Resp 15   Ht 5\' 9"  (1.753 m)   Wt 203 lb (92.1 kg)   BMI 29.98 kg/m    Physical Exam Vitals signs and nursing note reviewed.  Constitutional:      Appearance: He is well-developed.  HENT:  Head: Normocephalic and atraumatic.  Eyes:     Conjunctiva/sclera: Conjunctivae normal.     Pupils: Pupils are equal, round, and reactive to light.  Neck:     Musculoskeletal: Normal range of motion and neck supple.  Cardiovascular:     Rate and Rhythm: Normal rate and regular rhythm.     Heart sounds: Normal heart sounds.  Pulmonary:     Effort: Pulmonary effort is normal.     Breath sounds: Normal breath  sounds.  Abdominal:     General: Bowel sounds are normal.     Palpations: Abdomen is soft.  Skin:    General: Skin is warm and dry.     Capillary Refill: Capillary refill takes less than 2 seconds.  Neurological:     Mental Status: He is alert and oriented to person, place, and time.  Psychiatric:        Behavior: Behavior normal.      Musculoskeletal Exam: C-spine some limitation with range of motion.  Shoulder joints, elbow joints, wrist joints, MCPs PIPs with good range of motion.  He has DIP and PIP thickening in his hands and feet consistent with osteoarthritis.  Is crepitus in his knee joints without any warmth swelling or effusion.  CDAI Exam: CDAI Score: 0.4  Patient Global Assessment: 2 (mm); Provider Global Assessment: 2 (mm) Swollen: 0 ; Tender: 0  Joint Exam   Not documented   There is currently no information documented on the homunculus. Go to the Rheumatology activity and complete the homunculus joint exam.  Investigation: No additional findings.  Imaging: No results found.  Recent Labs: Lab Results  Component Value Date   WBC 5.7 09/28/2017   HGB 14.1 09/28/2017   PLT 177 09/28/2017   NA 140 09/28/2017   K 3.9 09/28/2017   CL 106 09/28/2017   CO2 29 09/28/2017   GLUCOSE 104 (H) 09/28/2017   BUN 15 09/28/2017   CREATININE 0.86 09/28/2017   BILITOT 0.7 09/28/2017   ALKPHOS 84 11/17/2016   AST 26 09/28/2017   ALT 12 09/28/2017   PROT 6.2 09/28/2017   ALBUMIN 3.8 11/17/2016   CALCIUM 8.9 09/28/2017   GFRAA 95 09/28/2017  January 09, 2018 TB Gold negative  Speciality Comments: No specialty comments available.  Procedures:  No procedures performed Allergies: Patient has no known allergies.   Assessment / Plan:     Visit Diagnoses: Rheumatoid arthritis of multiple sites with negative rheumatoid factor (HCC) -patient has no synovitis on examination today.  He has been doing really well on combination therapy.  We discussed displacing Humira to every  3 weeks if tolerated.  He would like to wait until he comes back from vacation.  [place]  Prescription refill for methotrexate was given.  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  He continues to have some stiffness.  High risk medication use - Humira every 14 days, methotrexate 8 tablets weekly along with folic acid 2 mg daily.  Most recent TB gold negative on 12/29/2017.  Most recent CBC/CMP stable on 07/12/2018 and will monitor every 3 months.  Standing orders in place.  Patient had Prevnar 13 in 2017 and unspecified pneumonia vaccine in 2005. Recommend annual flu and Shingrix vaccine.  Yearly skin examination was also discussed.  Idiopathic chronic gout of multiple sites without tophus-patient has not had any flare in a long time.  He has been on allopurinol 100 mg p.o. daily.  Primary osteoarthritis  of both knees-minimal discomfort.  Impingement syndrome of shoulder region, unspecified laterality  Essential hypertension-his blood pressure is controlled.  Chronic atrial fibrillation - on Warfarin  History of hyperglycemia   Association of heart disease with rheumatoid arthritis was discussed. Need to monitor blood pressure, cholesterol, and to exercise 30-60 minutes on daily basis was discussed. Poor dental hygiene can be a predisposing factor for rheumatoid arthritis. Good dental hygiene was discussed.  Orders: No orders of the defined types were placed in this encounter.  Meds ordered this encounter  Medications  . methotrexate (RHEUMATREX) 2.5 MG tablet    Sig: TAKE 8 TABLETS BY MOUTH ONCE WEEKLY AS DIRECTED    Dispense:  96 tablet    Refill:  0     Follow-Up Instructions: Return in about 5 months (around 01/09/2019) for Rheumatoid arthritis, Osteoarthritis.   Pollyann Savoy, MD  Note - This record has been created using Animal nutritionist.  Chart creation errors have been sought, but may not always  have been  located. Such creation errors do not reflect on  the standard of medical care.

## 2018-07-30 ENCOUNTER — Other Ambulatory Visit: Payer: Self-pay | Admitting: Rheumatology

## 2018-07-30 NOTE — Telephone Encounter (Signed)
Last Visit 03/09/18 Next Visit: 08/10/18  Okay to refill per Dr. Corliss Skains

## 2018-08-10 ENCOUNTER — Encounter: Payer: Self-pay | Admitting: Rheumatology

## 2018-08-10 ENCOUNTER — Ambulatory Visit (INDEPENDENT_AMBULATORY_CARE_PROVIDER_SITE_OTHER): Payer: Medicare Other | Admitting: Rheumatology

## 2018-08-10 VITALS — BP 126/67 | HR 69 | Resp 15 | Ht 69.0 in | Wt 203.0 lb

## 2018-08-10 DIAGNOSIS — Z79899 Other long term (current) drug therapy: Secondary | ICD-10-CM

## 2018-08-10 DIAGNOSIS — M1A09X0 Idiopathic chronic gout, multiple sites, without tophus (tophi): Secondary | ICD-10-CM | POA: Diagnosis not present

## 2018-08-10 DIAGNOSIS — Z8639 Personal history of other endocrine, nutritional and metabolic disease: Secondary | ICD-10-CM

## 2018-08-10 DIAGNOSIS — M170 Bilateral primary osteoarthritis of knee: Secondary | ICD-10-CM

## 2018-08-10 DIAGNOSIS — M0609 Rheumatoid arthritis without rheumatoid factor, multiple sites: Secondary | ICD-10-CM | POA: Diagnosis not present

## 2018-08-10 DIAGNOSIS — I10 Essential (primary) hypertension: Secondary | ICD-10-CM

## 2018-08-10 DIAGNOSIS — M7540 Impingement syndrome of unspecified shoulder: Secondary | ICD-10-CM

## 2018-08-10 DIAGNOSIS — I482 Chronic atrial fibrillation, unspecified: Secondary | ICD-10-CM

## 2018-08-10 DIAGNOSIS — M5030 Other cervical disc degeneration, unspecified cervical region: Secondary | ICD-10-CM

## 2018-08-10 MED ORDER — METHOTREXATE 2.5 MG PO TABS: ORAL_TABLET | ORAL | 0 refills | Status: DC

## 2018-08-10 NOTE — Patient Instructions (Addendum)
Cervical Strain and Sprain Rehab Ask your health care provider which exercises are safe for you. Do exercises exactly as told by your health care provider and adjust them as directed. It is normal to feel mild stretching, pulling, tightness, or discomfort as you do these exercises, but you should stop right away if you feel sudden pain or your pain gets worse.Do not begin these exercises until told by your health care provider. Stretching and range of motion exercises These exercises warm up your muscles and joints and improve the movement and flexibility of your neck. These exercises also help to relieve pain, numbness, and tingling. Exercise A: Cervical side bend  1. Using good posture, sit on a stable chair or stand up. 2. Without moving your shoulders, slowly tilt your left / right ear to your shoulder until you feel a stretch in your neck muscles. You should be looking straight ahead. 3. Hold for __________ seconds. 4. Repeat with the other side of your neck. Repeat __________ times. Complete this exercise __________ times a day. Exercise B: Cervical rotation  1. Using good posture, sit on a stable chair or stand up. 2. Slowly turn your head to the side as if you are looking over your left / right shoulder. ? Keep your eyes level with the ground. ? Stop when you feel a stretch along the side and the back of your neck. 3. Hold for __________ seconds. 4. Repeat this by turning to your other side. Repeat __________ times. Complete this exercise __________ times a day. Exercise C: Thoracic extension and pectoral stretch 1. Roll a towel or a small blanket so it is about 4 inches (10 cm) in diameter. 2. Lie down on your back on a firm surface. 3. Put the towel lengthwise, under your spine in the middle of your back. It should not be not under your shoulder blades. The towel should line up with your spine from your middle back to your lower back. 4. Put your hands behind your head and let  your elbows fall out to your sides. 5. Hold for __________ seconds. Repeat __________ times. Complete this exercise __________ times a day. Strengthening exercises These exercises build strength and endurance in your neck. Endurance is the ability to use your muscles for a long time, even after your muscles get tired. Exercise D: Upper cervical flexion, isometric 1. Lie on your back with a thin pillow behind your head and a small rolled-up towel under your neck. 2. Gently tuck your chin toward your chest and nod your head down to look toward your feet. Do not lift your head off the pillow. 3. Hold for __________ seconds. 4. Release the tension slowly. Relax your neck muscles completely before you repeat this exercise. Repeat __________ times. Complete this exercise __________ times a day. Exercise E: Cervical extension, isometric  1. Stand about 6 inches (15 cm) away from a wall, with your back facing the wall. 2. Place a soft object, about 6-8 inches (15-20 cm) in diameter, between the back of your head and the wall. A soft object could be a small pillow, a ball, or a folded towel. 3. Gently tilt your head back and press into the soft object. Keep your jaw and forehead relaxed. 4. Hold for __________ seconds. 5. Release the tension slowly. Relax your neck muscles completely before you repeat this exercise. Repeat __________ times. Complete this exercise __________ times a day. Posture and body mechanics Body mechanics refers to the movements and positions of your  body while you do your daily activities. Posture is part of body mechanics. Good posture and healthy body mechanics can help to relieve stress in your body's tissues and joints. Good posture means that your spine is in its natural S-curve position (your spine is neutral), your shoulders are pulled back slightly, and your head is not tipped forward. The following are general guidelines for applying improved posture and body mechanics to  your everyday activities. Standing   When standing, keep your spine neutral and keep your feet about hip-width apart. Keep a slight bend in your knees. Your ears, shoulders, and hips should line up.  When you do a task in which you stand in one place for a long time, place one foot up on a stable object that is 2-4 inches (5-10 cm) high, such as a footstool. This helps keep your spine neutral. Sitting   When sitting, keep your spine neutral and your keep feet flat on the floor. Use a footrest, if necessary, and keep your thighs parallel to the floor. Avoid rounding your shoulders, and avoid tilting your head forward.  When working at a desk or a computer, keep your desk at a height where your hands are slightly lower than your elbows. Slide your chair under your desk so you are close enough to maintain good posture.  When working at a computer, place your monitor at a height where you are looking straight ahead and you do not have to tilt your head forward or downward to look at the screen. Resting When lying down and resting, avoid positions that are most painful for you. Try to support your neck in a neutral position. You can use a contour pillow or a small rolled-up towel. Your pillow should support your neck but not push on it. This information is not intended to replace advice given to you by your health care provider. Make sure you discuss any questions you have with your health care provider. Document Released: 07/14/2005 Document Revised: 03/20/2016 Document Reviewed: 06/20/2015 Elsevier Interactive Patient Education  2019 [employer] Standing Labs We placed an order today for your standing lab work.    Please come back and get your standing labs in March and every 3 months TB Gold is due in June 2020  We have open lab Monday through Friday from 8:30-11:30 AM and 1:30-4:00 PM  at the office of Dr. Pollyann SavoyShaili Markee Matera.   You may experience shorter wait times on Monday and Friday  afternoons. The office is located at [address], [address], [place], [place] [address] No appointment is necessary.   Labs are drawn by [facility].  You may receive a bill from [facility] for your lab work.  If you wish to have your labs drawn at another location, please call the office 24 hours in advance to send orders.  If you have any questions regarding directions or hours of operation,  please call [phone].   Just as a reminder please drink plenty of water prior to coming for your lab work. Thanks!

## 2018-10-08 ENCOUNTER — Telehealth: Payer: Self-pay | Admitting: Pharmacist

## 2018-10-08 ENCOUNTER — Other Ambulatory Visit: Payer: Self-pay | Admitting: *Deleted

## 2018-10-08 DIAGNOSIS — Z79899 Other long term (current) drug therapy: Secondary | ICD-10-CM

## 2018-10-08 NOTE — Telephone Encounter (Signed)
Patient called requesting refill of Humira.    Last Visit: 08/10/2018 Next Visit: 01/12/2019 Labs: 07/15/2018 TB Gold: 07/15/2018  Informed patient he is due for labs.  Offered to send in a one month supply until he is able to get labs.  He prefers to go ahead and have labs drawn then send in a 3 month supply.  Orders faxed to his preferred lab in [place]. He is to call us Monday after he has labs and then we can send in new prescription.  He receives his medication through [employer].  All questions encouraged and answered.  Instructed patient to call with any further questions or concerns.   Verlin Fester, PharmD, [facility] Rheumatology Clinical Pharmacist  10/08/2018 9:50 AM

## 2018-10-12 NOTE — Telephone Encounter (Signed)
Received lab results drawn on 10/11/18 RBC- 4.07, MCV 105.2 MCH 34.9, RDW 14.9 Glucose 105, Alk phos. [place]

## 2018-10-12 NOTE — Telephone Encounter (Signed)
Patient left voicemail asking if we received his lab results.  Returned call and notified patient of his results and they were within normal limits.  We also faxed a new prescription to [facility].  Patient verbalized understanding.  Patient is also concerned about Covid-19.  Reviewed our recommendations of following CDC guidelines for proper hand hygience and isolation precautions.  Regarding your medications we recommend you continue them at this time as prescribed. In the event you do become sick please follow recommended procedure to hold medication until symptoms have resolved.  We understand concerns about increased risk of infection. If you do choose to stop your medications understand you are putting yourself at risk for a flare. Please call the office for any questions or acute issues. We are allowing some flexibly for labs and follow up visit. If you are having current respiratory symptoms do not come to the office, please utilize e-visits and your PCP for appropriate assessment and testing.   All questions encouraged and answered.  Instructed patient to call with any further questions or concerns.  Verlin Fester, PharmD, [facility] Rheumatology Clinical Pharmacist  10/12/2018 4:01 PM

## 2018-10-13 ENCOUNTER — Other Ambulatory Visit: Payer: Self-pay | Admitting: Rheumatology

## 2018-10-13 NOTE — Telephone Encounter (Signed)
Last Visit:08/10/2018 Next Visit:01/12/2019  Okay to refillper Dr. Deveshwar 

## 2018-10-17 ENCOUNTER — Other Ambulatory Visit: Payer: Self-pay | Admitting: Rheumatology

## 2018-10-18 NOTE — Telephone Encounter (Signed)
Last Visit: 08/10/2018 Next Visit: 01/12/2019 Labs: 10/11/2018 RBC- 4.07, MCV 105.2 MCH 34.9, RDW 14.9 Glucose 105, Alk phos. 124.  Okay to refill per Dr. Deveshwar.  

## 2018-12-17 ENCOUNTER — Telehealth: Payer: Self-pay | Admitting: Rheumatology

## 2018-12-17 DIAGNOSIS — Z79899 Other long term (current) drug therapy: Secondary | ICD-10-CM

## 2018-12-17 NOTE — Telephone Encounter (Signed)
Lab orders faxed.

## 2018-12-17 NOTE — Telephone Encounter (Signed)
Patient called requesting his labwork orders be sent to Dr. Dortha Kern office in [place].  Patient states he is planning to go on Tuesday, 12/21/18.  Patient states he will only have one injection left after his injection tomorrow.

## 2018-12-23 NOTE — Telephone Encounter (Signed)
Patient states lab orders were not received at Dr. Dortha Kern office. Please refax. Patient request a  Call to confirm when faxed over.

## 2018-12-23 NOTE — Telephone Encounter (Signed)
Advised patient's wife who is on dpr that lab orders have been refaxed.

## 2018-12-28 LAB — COMPLETE METABOLIC PANEL WITH GFR
AG Ratio: 1.3 (calc) (ref 1.0–2.5)
ALT: 12 U/L (ref 9–46)
AST: 21 U/L (ref 10–35)
Albumin: 3.8 g/dL (ref 3.6–5.1)
Alkaline phosphatase (APISO): 109 U/L (ref 35–144)
BUN: 13 mg/dL (ref 7–25)
CO2: 27 mmol/L (ref 20–32)
Calcium: 9 mg/dL (ref 8.6–10.3)
Chloride: 103 mmol/L (ref 98–110)
Creat: 0.7 mg/dL (ref 0.70–1.11)
GFR, Est African American: 103 mL/min/{1.73_m2} (ref >=60)
GFR, Est Non African American: 89 mL/min/{1.73_m2} (ref >=60)
Globulin: 3 g/dL (calc) (ref 1.9–3.7)
Glucose, Bld: 92 mg/dL (ref 65–99)
Potassium: 4.1 mmol/L (ref 3.5–5.3)
Sodium: 140 mmol/L (ref 135–146)
Total Bilirubin: 0.8 mg/dL (ref 0.2–1.2)
Total Protein: 6.8 g/dL (ref 6.1–8.1)

## 2018-12-28 LAB — CBC WITH DIFFERENTIAL/PLATELET
Absolute Monocytes: 637 cells/uL (ref 200–950)
Basophils Absolute: 39 cells/uL (ref 0–200)
Basophils Relative: 0.6 %
Eosinophils Absolute: 124 cells/uL (ref 15–500)
Eosinophils Relative: 1.9 %
HCT: 39.6 % (ref 38.5–50.0)
Hemoglobin: 13.6 g/dL (ref 13.2–17.1)
Lymphs Abs: 2893 cells/uL (ref 850–3900)
MCH: 33.7 pg — ABNORMAL HIGH (ref 27.0–33.0)
MCHC: 34.3 g/dL (ref 32.0–36.0)
MCV: 98 fL (ref 80.0–100.0)
MPV: 11.3 fL (ref 7.5–12.5)
Monocytes Relative: 9.8 %
Neutro Abs: 2808 cells/uL (ref 1500–7800)
Neutrophils Relative %: 43.2 %
Platelets: 190 10*3/uL (ref 140–400)
RBC: 4.04 10*6/uL — ABNORMAL LOW (ref 4.20–5.80)
RDW: 12.9 % (ref 11.0–15.0)
Total Lymphocyte: 44.5 %
WBC: 6.5 10*3/uL (ref 3.8–10.8)

## 2018-12-30 ENCOUNTER — Other Ambulatory Visit: Payer: Self-pay | Admitting: *Deleted

## 2018-12-30 MED ORDER — ADALIMUMAB 40 MG/0.4ML ~~LOC~~ AJKT: 40.0000 mg | AUTO-INJECTOR | SUBCUTANEOUS | 0 refills | Status: DC

## 2018-12-30 NOTE — Telephone Encounter (Signed)
Refill request received from [facility]   Last Visit: 08/10/2018 Next Visit: 01/12/2019 Labs: 12/27/18 CMP WNL. RBC count is borderline low. We will continue to monitor TB Gold: 12/29/17 Neg   Okay to refill per Dr. Corliss Skains

## 2018-12-30 NOTE — Progress Notes (Addendum)
Office Visit Note  Patient: Billy Hurley             Date of Birth: Jan 21, 1936           MRN: [id number]             PCP: Christena Flake, MD Referring: Christena Flake, MD Visit Date: 01/12/2019 Occupation: @  Subjective:  Medication monitoring    History of Present Illness: Rutherford Alarie is a 83 y.o. male with history of seronegative rheumatoid arthritis, osteoarthritis, and gout. He is on Humira 40 mg sq injections every 14 days, MTX 8 tablets po once weekly, and folic acid 2 mg po daily.  He has not missed any doses recently.  He denies any joint pain or joint swelling at this time.  He has chronic neck stiffness.  He denies any recent rheumatoid arthritis flares.  He denies any recent gout flares.  He is taking allopurinol 100 mg po daily.       Activities of Daily Living:  Patient reports morning stiffness for 0 minutes.   Patient Denies nocturnal pain.  Difficulty dressing/grooming: Denies Difficulty climbing stairs: Denies Difficulty getting out of chair: Denies Difficulty using hands for taps, buttons, cutlery, and/or writing: Denies  Review of Systems  Constitutional: Negative for fatigue and night sweats.  HENT: Negative for mouth sores, mouth dryness and nose dryness.   Eyes: Negative for redness, itching and dryness.  Respiratory: Negative for cough, hemoptysis, shortness of breath, wheezing and difficulty breathing.   Cardiovascular: Negative for chest pain, palpitations, hypertension, irregular heartbeat and swelling in legs/feet.  Gastrointestinal: Negative for blood in stool, constipation and diarrhea.  Endocrine: Negative for increased urination.  Genitourinary: Negative for painful urination and pelvic pain.  Musculoskeletal: Negative for arthralgias, joint pain, joint swelling, myalgias, muscle weakness, morning stiffness, muscle tenderness and myalgias.  Skin: Negative for color change, rash, hair loss, nodules/bumps, redness, skin tightness, ulcers  and sensitivity to sunlight.  Allergic/Immunologic: Negative for susceptible to infections.  Neurological: Negative for dizziness, fainting, light-headedness, headaches, memory loss, night sweats and weakness.  Hematological: Negative for swollen glands.  Psychiatric/Behavioral: Negative for depressed mood, confusion and sleep disturbance. The patient is not nervous/anxious.     PMFS History:  Patient Active Problem List   Diagnosis Date Noted  . Rheumatoid arthritis of multiple sites with negative rheumatoid factor (HCC) 10/28/2016  . High risk medication use 10/28/2016  . Idiopathic chronic gout of multiple sites without tophus 09/17/2016  . Bilateral knee effusions 09/17/2016  . History of gout 08/26/2016  . Primary osteoarthritis of both knees 08/26/2016  . Impingement syndrome of shoulder region 08/26/2016  . Essential hypertension 08/26/2016  . History of hyperglycemia 08/26/2016  . Chronic atrial fibrillation 08/26/2016  . Other sleep apnea 08/26/2016    Past Medical History:  Diagnosis Date  . A-fib (HCC)   . Gout   . Hypertension     Family History  Problem Relation Age of Onset  . Stroke Brother   . Hypertension Brother   . Hypertension Brother    Past Surgical History:  Procedure Laterality Date  . APPENDECTOMY    . TOTAL SHOULDER ARTHROPLASTY     Social History   Social History Narrative  . Not on file   Immunization History  Administered Date(s) Administered  . DT 07/11/2008  . Influenza, High Dose Seasonal PF 06/29/2017, 05/12/2018  . Pneumococcal Conjugate-13 08/03/2015  . Pneumococcal-Unspecified 08/07/2003  . Tetanus 07/11/2008     Objective: Vital Signs: BP (!) 154/70 (  BP Location: Left Arm, Patient Position: Sitting, Cuff Size: Normal)   Pulse (!) 51   Resp 15   Ht 5\' 8"  (1.727 m)   Wt 197 lb 9.6 oz (89.6 kg)   BMI 30.04 kg/m    Physical Exam Vitals signs and nursing note reviewed.  Constitutional:      Appearance: He is  well-developed.  HENT:     Head: Normocephalic and atraumatic.  Eyes:     Conjunctiva/sclera: Conjunctivae normal.     Pupils: Pupils are equal, round, and reactive to light.  Neck:     Musculoskeletal: Normal range of motion and neck supple.  Cardiovascular:     Rate and Rhythm: Normal rate and regular rhythm.     Heart sounds: Normal heart sounds.  Pulmonary:     Effort: Pulmonary effort is normal.     Breath sounds: Normal breath sounds.  Abdominal:     General: Bowel sounds are normal.     Palpations: Abdomen is soft.  Lymphadenopathy:     Cervical: No cervical adenopathy.  Skin:    General: Skin is warm and dry.     Capillary Refill: Capillary refill takes less than 2 seconds.  Neurological:     Mental Status: He is alert and oriented to person, place, and time.  Psychiatric:        Behavior: Behavior normal.      Musculoskeletal Exam: C-spine limited ROM. Mild thoracic kyphosis. Shoulder joints, elbow joints, wrist joints, MCPs, PIPs, and DIPs good ROM with no synovitis.  Complete fist formation.  Hip joints, knee joints, ankle joints, MTPs, PIPs, and DIPs good ROM with no synovitis.  No warmth or effusion of knee joints.  No tenderness or swelling of ankle joints.   CDAI Exam: CDAI Score: - Patient Global: -; Provider Global: - Swollen: -; Tender: - Joint Exam   No joint exam has been documented for this visit   There is currently no information documented on the homunculus. Go to the Rheumatology activity and complete the homunculus joint exam.  Investigation: No additional findings.  Imaging: No results found.  Recent Labs: Lab Results  Component Value Date   WBC 6.5 12/27/2018   HGB 13.6 12/27/2018   PLT 190 12/27/2018   NA 140 12/27/2018   K 4.1 12/27/2018   CL 103 12/27/2018   CO2 27 12/27/2018   GLUCOSE 92 12/27/2018   BUN 13 12/27/2018   CREATININE 0.70 12/27/2018   BILITOT 0.8 12/27/2018   ALKPHOS 84 11/17/2016   AST 21 12/27/2018   ALT 12  12/27/2018   PROT 6.8 12/27/2018   ALBUMIN 3.8 11/17/2016   CALCIUM 9.0 12/27/2018   GFRAA 103 12/27/2018    Speciality Comments: No specialty comments available.  Procedures:  No procedures performed Allergies: Patient has no known allergies.      Assessment / Plan:     Visit Diagnoses: Rheumatoid arthritis of multiple sites with negative rheumatoid factor (HCC) - He has no active synovitis on exam.  He has not had any recent rheumatoid arthritis. He is clinically doing well on Humira 40 mg sq injections every 14 days, Methotrexate 8 tablets po once weekly, and folic acid 2 mg po daily.  He has no joint pain or joint swelling at this time. He has no morning stiffness. We discussed spacing Humira to every 3 weeks and continuing on MTX 8 tablets po once weekly.  He is in agreement.  We discussed that if he has increased joint pain or  joint swelling he will resume injecting Humira every 14 days.  He does not need any refills at this time.  He will follow up in 5 months.   High risk medication use - Humira 40 mg every 14 days, methotrexate 8 tablets every 7 days, and folic acid 1 mg 2 tablets daily.  She receives her Humira through [facility] patient assistance.  Last TB gold negative 12/29/2017.  Due for TB gold today and will monitor yearly.  Most recent CBC/CMP within normal limits except for borderline low RBC on 12/27/2018 and will monitor every 3 months.  Standing orders are in place.  He received high-dose flu vaccine in October and previously Prevnar 13 and Pneumovax 23.    Idiopathic chronic gout of multiple sites without tophus -He has not had any recent gout flares. He is clinically doing well on allopurinol 100 mg p.o. daily. Uric acid was 6.5 on 09/28/17.  He was advised to   DDD (degenerative disc disease), cervical - disc space narrowing between C5-C6 and C6-C7. - He has limited ROM of the C-spine on exam.  He experiences stiffness in his neck.  He has no symptoms of radiculopathy at this  time.   Primary osteoarthritis of both knees - He has good ROM with no discomfort.  No warmth or effusion of knee joints.    Impingement syndrome of shoulder region, unspecified laterality - He has good ROM of bilateral shoulder joints.   Other medical conditions are listed as follows:   History of hyperglycemia  Essential hypertension   Chronic atrial fibrillation - on Warfarin   Orders: Orders Placed This Encounter  Procedures  . QuantiFERON-TB Gold Plus  . Uric acid   No orders of the defined types were placed in this encounter.     Follow-Up Instructions: Return in about 5 months (around 06/14/2019) for Rheumatoid arthritis, Gout, Osteoarthritis.   Gearldine Bienenstockaylor M Dale, PA-C   I examined and evaluated the patient with Sherron Alesaylor Dale PA.  Patient had no synovitis on examination.  His gout has been well controlled.  I detailed discussion with him about continuing current medication and try to space Humira to every 3 weeks if tolerated.  The plan of care was discussed as noted above.  Pollyann SavoyShaili Deveshwar, MD  Note - This record has been created using [employer]agon software.  Chart creation errors have been sought, but may not always  have been located. Such creation errors do not reflect on  the standard of medical care.

## 2019-01-04 ENCOUNTER — Other Ambulatory Visit: Payer: Self-pay | Admitting: Rheumatology

## 2019-01-04 NOTE — Telephone Encounter (Signed)
Last Visit:08/10/2018 Next Visit:01/12/2019  Okay to refillper Dr. Deveshwar 

## 2019-01-12 ENCOUNTER — Encounter: Payer: Self-pay | Admitting: Rheumatology

## 2019-01-12 ENCOUNTER — Other Ambulatory Visit: Payer: Self-pay

## 2019-01-12 ENCOUNTER — Ambulatory Visit (INDEPENDENT_AMBULATORY_CARE_PROVIDER_SITE_OTHER): Payer: Medicare Other | Admitting: Rheumatology

## 2019-01-12 VITALS — BP 154/70 | HR 51 | Resp 15 | Ht 68.0 in | Wt 197.6 lb

## 2019-01-12 DIAGNOSIS — M170 Bilateral primary osteoarthritis of knee: Secondary | ICD-10-CM

## 2019-01-12 DIAGNOSIS — M7540 Impingement syndrome of unspecified shoulder: Secondary | ICD-10-CM

## 2019-01-12 DIAGNOSIS — M5030 Other cervical disc degeneration, unspecified cervical region: Secondary | ICD-10-CM

## 2019-01-12 DIAGNOSIS — I482 Chronic atrial fibrillation, unspecified: Secondary | ICD-10-CM

## 2019-01-12 DIAGNOSIS — M0609 Rheumatoid arthritis without rheumatoid factor, multiple sites: Secondary | ICD-10-CM

## 2019-01-12 DIAGNOSIS — Z8639 Personal history of other endocrine, nutritional and metabolic disease: Secondary | ICD-10-CM

## 2019-01-12 DIAGNOSIS — Z79899 Other long term (current) drug therapy: Secondary | ICD-10-CM

## 2019-01-12 DIAGNOSIS — I10 Essential (primary) hypertension: Secondary | ICD-10-CM

## 2019-01-12 DIAGNOSIS — M1A09X0 Idiopathic chronic gout, multiple sites, without tophus (tophi): Secondary | ICD-10-CM

## 2019-01-12 NOTE — Patient Instructions (Addendum)
Continue taking Methotrexate 8 tablets by mouth once weekly Start spacing Humira injections to every 3 weeks   Standing Labs We placed an order today for your standing lab work.    Please come back and get your standing labs in September   CBC, CMP, TB gold, and uric acid   We have open lab daily Monday through Thursday from 8:30-12:30 PM and 1:30-4:30 PM and Friday from 8:30-12:30 PM and 1:30 -4:00 PM at the office of Dr. Bo Merino.   You may experience shorter wait times on Monday and Friday afternoons. The office is located at [address], [place], [place],  [address] No appointment is necessary.   Labs are drawn by [facility].  You may receive a bill from [facility] for your lab work.  If you wish to have your labs drawn at another location, please call the office 24 hours in advance to send orders.  If you have any questions regarding directions or hours of operation,  please call [phone].   Just as a reminder please drink plenty of water prior to coming for your lab work. Thanks!

## 2019-01-24 ENCOUNTER — Other Ambulatory Visit: Payer: Self-pay | Admitting: Rheumatology

## 2019-01-24 NOTE — Telephone Encounter (Signed)
Last Visit:08/10/2018 Next Visit:01/12/2019 Labs:12/27/18 CMP WNL. RBC count is borderline low. We will continue to monitor  Okay to refill per Dr. Estanislado Pandy

## 2019-03-30 ENCOUNTER — Telehealth: Payer: Self-pay

## 2019-03-30 NOTE — Telephone Encounter (Signed)
Received Humira refill request via fax from [facility].   Last Visit: 01/12/2019 Next Visit: 06/14/2019 Labs: 12/27/2018 CMP WNL. RBC count is borderline low. We will continue to monitor TB Gold: 12/29/2017 negative   Called patient and advised patient he is due to update labs, patient verbalized understanding and requested lab orders be faxed to [facility] in [place], [place]. Patient states he will go have labs drawn on Friday or next Monday.  [facility] lab fax [phone]  Patient states he is injecting Humira every 3 weeks due to "doing so good right now". Patient is aware that prescription will be sent after lab results are received.

## 2019-04-06 NOTE — Telephone Encounter (Signed)
Spoke with patient and he states he is scheduled to have his labs drawn 04/11/19.

## 2019-04-09 ENCOUNTER — Other Ambulatory Visit: Payer: Self-pay | Admitting: Rheumatology

## 2019-04-11 NOTE — Telephone Encounter (Addendum)
Last Visit:08/10/2018 Next Visit:01/12/2019 Labs:12/27/18 CMP WNL. RBC count is borderline low. We will continue to monitor  Okay to refill per Dr. Deveshwar  

## 2019-04-12 NOTE — Progress Notes (Signed)
Uric acid is within the desirable range.

## 2019-04-14 ENCOUNTER — Telehealth: Payer: Self-pay | Admitting: Rheumatology

## 2019-04-14 LAB — COMPLETE METABOLIC PANEL WITH GFR
AG Ratio: 1.1 (calc) (ref 1.0–2.5)
ALT: 11 U/L (ref 9–46)
AST: 21 U/L (ref 10–35)
Albumin: 3.6 g/dL (ref 3.6–5.1)
Alkaline phosphatase (APISO): 129 U/L (ref 35–144)
BUN/Creatinine Ratio: 19 (calc) (ref 6–22)
BUN: 13 mg/dL (ref 7–25)
CO2: 25 mmol/L (ref 20–32)
Calcium: 8.9 mg/dL (ref 8.6–10.3)
Chloride: 105 mmol/L (ref 98–110)
Creat: 0.69 mg/dL — ABNORMAL LOW (ref 0.70–1.11)
GFR, Est African American: 103 mL/min/{1.73_m2} (ref >=60)
GFR, Est Non African American: 89 mL/min/{1.73_m2} (ref >=60)
Globulin: 3.2 g/dL (calc) (ref 1.9–3.7)
Glucose, Bld: 145 mg/dL — ABNORMAL HIGH (ref 65–99)
Potassium: 3.7 mmol/L (ref 3.5–5.3)
Sodium: 140 mmol/L (ref 135–146)
Total Bilirubin: 0.7 mg/dL (ref 0.2–1.2)
Total Protein: 6.8 g/dL (ref 6.1–8.1)

## 2019-04-14 LAB — CBC WITH DIFFERENTIAL/PLATELET
Absolute Monocytes: 433 cells/uL (ref 200–950)
Basophils Absolute: 31 cells/uL (ref 0–200)
Basophils Relative: 0.5 %
Eosinophils Absolute: 128 cells/uL (ref 15–500)
Eosinophils Relative: 2.1 %
HCT: 39.1 % (ref 38.5–50.0)
Hemoglobin: 13.6 g/dL (ref 13.2–17.1)
Lymphs Abs: 1720 cells/uL (ref 850–3900)
MCH: 34.3 pg — ABNORMAL HIGH (ref 27.0–33.0)
MCHC: 34.8 g/dL (ref 32.0–36.0)
MCV: 98.7 fL (ref 80.0–100.0)
MPV: 10.7 fL (ref 7.5–12.5)
Monocytes Relative: 7.1 %
Neutro Abs: 3788 cells/uL (ref 1500–7800)
Neutrophils Relative %: 62.1 %
Platelets: 218 10*3/uL (ref 140–400)
RBC: 3.96 10*6/uL — ABNORMAL LOW (ref 4.20–5.80)
RDW: 13.1 % (ref 11.0–15.0)
Total Lymphocyte: 28.2 %
WBC: 6.1 10*3/uL (ref 3.8–10.8)

## 2019-04-14 LAB — QUANTIFERON-TB GOLD PLUS
Mitogen-NIL: 9.82 IU/mL
NIL: 0.07 IU/mL
QuantiFERON-TB Gold Plus: NEGATIVE
TB1-NIL: 0 IU/mL
TB2-NIL: 0.02 IU/mL

## 2019-04-14 LAB — URIC ACID: Uric Acid, Serum: 5.6 mg/dL (ref 4.0–8.0)

## 2019-04-14 MED ORDER — HUMIRA (2 PEN) 40 MG/0.4ML ~~LOC~~ AJKT: 40.0000 mg | AUTO-INJECTOR | SUBCUTANEOUS | 0 refills | Status: DC

## 2019-04-14 NOTE — Telephone Encounter (Signed)
Patient has updated labs and we received results.

## 2019-04-14 NOTE — Progress Notes (Signed)
TB gold negative

## 2019-04-14 NOTE — Addendum Note (Signed)
Addended by: Carole Binning on: 04/14/2019 04:13 PM   Modules accepted: Orders

## 2019-04-14 NOTE — Telephone Encounter (Signed)
Patient called requesting a return call regarding his labwork and prescription of Humira.

## 2019-04-14 NOTE — Progress Notes (Signed)
Glucose is elevated.  Most likely the sample was nonfasting.

## 2019-04-14 NOTE — Telephone Encounter (Signed)
Patient advised of lab results and prescription sent to [facility] assist.

## 2019-05-09 ENCOUNTER — Telehealth: Payer: Self-pay | Admitting: Rheumatology

## 2019-05-09 NOTE — Telephone Encounter (Signed)
Patient calling to find out about reappying for patient assistance for Humira with [facility]. Patient states he did this in August of last year, and just has not heard anything so far this year. Please call to advise.

## 2019-05-10 NOTE — Telephone Encounter (Signed)
Called patient,  and discussed that I requested application to be mailed to him from [place]. He will complete and bring to office with income documents. He will call if he has any questions.  Dina Rich- [phone]  10:32 AM Beatriz Chancellor, CPhT

## 2019-05-20 ENCOUNTER — Telehealth: Payer: Self-pay | Admitting: Rheumatology

## 2019-05-20 NOTE — Telephone Encounter (Signed)
Returned patient's call. He only needs to complete the patient portion and to include his household income documents. Office will complete provider portion.  3:03 PM Beatriz Chancellor, CPhT

## 2019-05-20 NOTE — Telephone Encounter (Signed)
Patient calling to talk to Rachael about his patient assistance application. Patient wants to make sure he has just one page that he has to fill out. Please call patient to advise.

## 2019-05-21 ENCOUNTER — Other Ambulatory Visit: Payer: Self-pay | Admitting: Rheumatology

## 2019-05-23 NOTE — Telephone Encounter (Signed)
Last Visit: 01/12/19  Next Visit: 06/14/19 Labs: 04/11/19 Glucose is elevated  Okay to refill per Dr. Estanislado Pandy

## 2019-05-25 ENCOUNTER — Telehealth: Payer: Self-pay | Admitting: Pharmacist

## 2019-05-25 NOTE — Telephone Encounter (Signed)
Submitted a Prior Authorization request to [employer] for HUMIRA via Cover My Meds. Will update once we receive a response.    

## 2019-05-25 NOTE — Telephone Encounter (Signed)
Received notification from [facility] regarding a prior authorization for [place]. Authorization has been APPROVED from 05/25/2019 to 07/27/2020.   Will send document to scan center.  Authorization # [id number] Phone # [phone]  10:23 AM Beatriz Chancellor, CPhT

## 2019-05-25 NOTE — Telephone Encounter (Signed)
Income documents returned via mail 05/25/2019.   Mariella Saa, PharmD, [place], [place] Clinical Specialty Pharmacist [phone]  05/25/2019 9:51 AM

## 2019-05-25 NOTE — Telephone Encounter (Signed)
Received complete renewal application for Humira along with income documents in the mail.  Will fax application when provider portion is complete.  Last PA approved through 07/27/2017.  Will need update PA for renewal application.  Please submit PA via covermymeds.   Mariella Saa, PharmD, [place], [place] Clinical Specialty Pharmacist [phone]  05/25/2019 9:37 AM

## 2019-05-26 ENCOUNTER — Telehealth: Payer: Self-pay | Admitting: Rheumatology

## 2019-05-26 NOTE — Telephone Encounter (Signed)
Patient called stating he had questions regarding his a application.

## 2019-05-26 NOTE — Telephone Encounter (Signed)
Completed application faxed to [facility].  Will update when we hear a response.   Mariella Saa, PharmD, [place], [place] Clinical Specialty Pharmacist [phone]  05/26/2019 8:27 AM

## 2019-05-26 NOTE — Telephone Encounter (Signed)
Patient called with questions about Humira PAP.  He states that he received a voicemail from insurance regarding his Humira and when he called back they had not information about the call.  He had issues with his renewal application last year and is concerned that we will have the same issue and cause a delay in his approval.  Advised patient that the call was most likely informing him that Humira had been approved through insurance.  Informed that a prior authorization is required to be submitted along with his application for patient assistance.  We submitted the prior authorization to insurance and it was approved.  Informed patient that we faxed his application this morning and do not foresee any issues or delays in his approval.  Advised that we will check on application status next week and update him.  Patient verbalized understanding.  All questions encouraged and answered.  Instructed patient to call with any other questions or concerns.   Mariella Saa, PharmD, [place], [place] Clinical Specialty Pharmacist [phone]  05/26/2019 11:07 AM

## 2019-05-31 NOTE — Progress Notes (Signed)
Office Visit Note  Patient: Billy Hurley             Date of Birth: 1935/12/06           MRN: [id number]             PCP: Olena Mater, MD Referring: Olena Mater, MD Visit Date: 06/14/2019 Occupation: @GUAROCC @  Subjective:  Medication monitoring   History of Present Illness: Billy Hurley is a 83 y.o. male with history of seronegative rheumatoid arthritis and gout.  He is on Humira 40 mg sq injections every 21 days, MTX 8 tablets by mouth once weekly, and folic acid 2 mg po daily. He denies any recent RA flares.  He denies any joint pain or joint swelling.  He denies any recent gout flares.  He is taking allopurinol 100 mg po daily.    Activities of Daily Living:  Patient reports morning stiffness for 0 none.   Patient Denies nocturnal pain.  Difficulty dressing/grooming: Denies Difficulty climbing stairs: Denies Difficulty getting out of chair: Denies Difficulty using hands for taps, buttons, cutlery, and/or writing: Denies  Review of Systems  Constitutional: Negative for fatigue and night sweats.  HENT: Negative for mouth sores, mouth dryness and nose dryness.   Eyes: Negative for photophobia, redness, visual disturbance and dryness.  Respiratory: Negative for cough, hemoptysis, shortness of breath and difficulty breathing.   Cardiovascular: Positive for irregular heartbeat. Negative for chest pain, palpitations, hypertension and swelling in legs/feet.  Gastrointestinal: Negative for blood in stool, constipation and diarrhea.  Endocrine: Negative for excessive thirst and increased urination.  Genitourinary: Negative for difficulty urinating and painful urination.  Musculoskeletal: Negative for arthralgias, joint pain, joint swelling, myalgias, muscle weakness, morning stiffness, muscle tenderness and myalgias.  Skin: Negative for color change, rash, hair loss, nodules/bumps, skin tightness, ulcers and sensitivity to sunlight.  Allergic/Immunologic: Negative for  susceptible to infections.  Neurological: Negative for dizziness, fainting, numbness, memory loss, night sweats and weakness.  Hematological: Negative for bruising/bleeding tendency and swollen glands.  Psychiatric/Behavioral: Negative for depressed mood and sleep disturbance. The patient is not nervous/anxious.     PMFS History:  Patient Active Problem List   Diagnosis Date Noted  . Rheumatoid arthritis of multiple sites with negative rheumatoid factor ([place]) 10/28/2016  . High risk medication use 10/28/2016  . Idiopathic chronic gout of multiple sites without tophus 09/17/2016  . Bilateral knee effusions 09/17/2016  . History of gout 08/26/2016  . Primary osteoarthritis of both knees 08/26/2016  . Impingement syndrome of shoulder region 08/26/2016  . Essential hypertension 08/26/2016  . History of hyperglycemia 08/26/2016  . Chronic atrial fibrillation ([place]) 08/26/2016  . Other sleep apnea 08/26/2016    Past Medical History:  Diagnosis Date  . A-fib ([place])   . Gout   . Hypertension     Family History  Problem Relation Age of Onset  . Stroke Brother   . Hypertension Brother   . Hypertension Brother    Past Surgical History:  Procedure Laterality Date  . APPENDECTOMY    . TOTAL SHOULDER ARTHROPLASTY     Social History   Social History Narrative  . Not on file   Immunization History  Administered Date(s) Administered  . DT 07/11/2008  . Influenza, High Dose Seasonal PF 06/29/2017, 05/12/2018  . Pneumococcal Conjugate-13 08/03/2015  . Pneumococcal-Unspecified 08/07/2003  . Tetanus 07/11/2008     Objective: Vital Signs: BP 135/67 (BP Location: Left Arm, Patient Position: Sitting, Cuff Size: Normal)   Pulse (!) 57  Resp 18   Ht 5\' 9"  (1.753 m)   Wt 192 lb 9.6 oz (87.4 kg)   BMI 28.44 kg/m    Physical Exam Vitals signs and nursing note reviewed.  Constitutional:      Appearance: He is well-developed.  HENT:     Head: Normocephalic and atraumatic.  Eyes:      Conjunctiva/sclera: Conjunctivae normal.     Pupils: Pupils are equal, round, and reactive to light.  Neck:     Musculoskeletal: Normal range of motion and neck supple.  Cardiovascular:     Rate and Rhythm: Normal rate and regular rhythm.     Heart sounds: Normal heart sounds.  Pulmonary:     Effort: Pulmonary effort is normal.     Breath sounds: Normal breath sounds.  Abdominal:     General: Bowel sounds are normal.     Palpations: Abdomen is soft.  Skin:    General: Skin is warm and dry.     Capillary Refill: Capillary refill takes less than 2 seconds.  Neurological:     Mental Status: He is alert and oriented to person, place, and time.  Psychiatric:        Behavior: Behavior normal.      Musculoskeletal Exam: C-spine limited range of motion.  Mild thoracic kyphosis noted.  No midline spinal tenderness.  No SI joint tenderness.  Shoulder joints have good range of motion with no discomfort at this time.  Elbow joints, wrist joints, MCPs, PIPs, DIPs good range of motion with no synovitis.  He has PIP and DIP synovial thickening consistent with osteoarthritis of both hands.  He has complete fist formation bilaterally.  Hip joints have good range of motion no discomfort.  Knee joints have good range of motion with no warmth or effusion.  No tenderness or swelling of ankle joints  CDAI Exam: CDAI Score: 0.2  Patient Global: 1 mm; Provider Global: 1 mm Swollen: 0 ; Tender: 0  Joint Exam   No joint exam has been documented for this visit   There is currently no information documented on the homunculus. Go to the Rheumatology activity and complete the homunculus joint exam.  Investigation: No additional findings.  Imaging: No results found.  Recent Labs: Lab Results  Component Value Date   WBC 6.1 04/11/2019   HGB 13.6 04/11/2019   PLT 218 04/11/2019   NA 140 04/11/2019   K 3.7 04/11/2019   CL 105 04/11/2019   CO2 25 04/11/2019   GLUCOSE 145 (H) 04/11/2019   BUN 13  04/11/2019   CREATININE 0.69 (L) 04/11/2019   BILITOT 0.7 04/11/2019   ALKPHOS 84 11/17/2016   AST 21 04/11/2019   ALT 11 04/11/2019   PROT 6.8 04/11/2019   ALBUMIN 3.8 11/17/2016   CALCIUM 8.9 04/11/2019   GFRAA 103 04/11/2019   QFTBGOLDPLUS NEGATIVE 04/11/2019    Speciality Comments: No specialty comments available.  Procedures:  No procedures performed Allergies: Other   Assessment / Plan:     Visit Diagnoses: Rheumatoid arthritis of multiple sites with negative rheumatoid factor (HCC): He has no synovitis on exam.  He has not had any recent rheumatoid arthritis flares.  He is clinically doing well on Humira 40 mg subcutaneous injections every 21 days, methotrexate 8 tablets by mouth once weekly, and folic acid 2 mg by mouth daily.  He has not noticed any increased joint pain or joint swelling since spacing the dose of Humira.  He would like to continue on the current  treatment regimen.  He does not need any refills at this time.  He was advised to notify us if he develops increased joint pain or joint swelling.  He will follow-up in the office in 5 months.  High risk medication use - Humira 40 mg every 21 days, methotrexate 8 tablets every 7 days, and folic acid 1 mg 2 tablets daily.  Last TB gold negative on 04/11/2019 and will monitor yearly.  Most recent CBC/CMP within normal limit son 04/11/2019 and will monitor every 3 months.  Standing orders are in place. He receives Humira through [facility] patient assistance.    Idiopathic chronic gout of multiple sites without tophus - He has not had any recent gout flares.  He is clinically doing well on allopurinol 100 mg p.o. daily.  Uric acid was 5.6 on 04/11/2019.  He will continue taking allopurinol as prescribed.  He was advised to notify us if he develops signs or symptoms of a gout flare.  DDD (degenerative disc disease), cervical - disc space narrowing between C5-C6 and C6-C7.  He has limited range of motion but no discomfort at this time.   He has no symptoms of radiculopathy.  Primary osteoarthritis of both knees: He has good range of motion of bilateral knee joints.  No warmth or effusion was noted.  He has bilateral knee crepitus.  Impingement syndrome of shoulder region, unspecified laterality: He has good range of motion bilateral shoulder joints with no discomfort at this time.  Other medical conditions are listed as follows:  Essential hypertension  Chronic atrial fibrillation (HCC)  Orders: No orders of the defined types were placed in this encounter.  No orders of the defined types were placed in this encounter.     Follow-Up Instructions: Return in about 5 months (around 11/12/2019) for Rheumatoid arthritis, Gout.   Gearldine Bienenstockaylor M Talha Iser, PA-C   I examined and evaluated the patient with Sherron Alesaylor Hector Venne PA.  Patient had no synovitis on my examination.  He will continue current regimen.  The plan of care was discussed as noted above.  Pollyann SavoyShaili Deveshwar, MD  Note - This record has been created using Animal [employer]istDragon software.  Chart creation errors have been sought, but may not always  have been located. Such creation errors do not reflect on  the standard of medical care.

## 2019-06-14 ENCOUNTER — Ambulatory Visit (INDEPENDENT_AMBULATORY_CARE_PROVIDER_SITE_OTHER): Payer: Medicare Other | Admitting: Rheumatology

## 2019-06-14 ENCOUNTER — Encounter: Payer: Self-pay | Admitting: Rheumatology

## 2019-06-14 ENCOUNTER — Other Ambulatory Visit: Payer: Self-pay

## 2019-06-14 VITALS — BP 135/67 | HR 57 | Resp 18 | Ht 69.0 in | Wt 192.6 lb

## 2019-06-14 DIAGNOSIS — I482 Chronic atrial fibrillation, unspecified: Secondary | ICD-10-CM

## 2019-06-14 DIAGNOSIS — M0609 Rheumatoid arthritis without rheumatoid factor, multiple sites: Secondary | ICD-10-CM | POA: Diagnosis not present

## 2019-06-14 DIAGNOSIS — M1A09X0 Idiopathic chronic gout, multiple sites, without tophus (tophi): Secondary | ICD-10-CM

## 2019-06-14 DIAGNOSIS — M7540 Impingement syndrome of unspecified shoulder: Secondary | ICD-10-CM

## 2019-06-14 DIAGNOSIS — M5030 Other cervical disc degeneration, unspecified cervical region: Secondary | ICD-10-CM | POA: Diagnosis not present

## 2019-06-14 DIAGNOSIS — I10 Essential (primary) hypertension: Secondary | ICD-10-CM

## 2019-06-14 DIAGNOSIS — M170 Bilateral primary osteoarthritis of knee: Secondary | ICD-10-CM

## 2019-06-14 DIAGNOSIS — Z79899 Other long term (current) drug therapy: Secondary | ICD-10-CM | POA: Diagnosis not present

## 2019-06-14 NOTE — Patient Instructions (Addendum)
Standing Labs We placed an order today for your standing lab work.    Please come back and get your standing labs in mid-December and every 3 months   We have open lab daily Monday through Thursday from 8:30-12:30 PM and 1:30-4:30 PM and Friday from 8:30-12:30 PM and 1:30-4:00 PM at the office of Dr. Bo Merino.   You may experience shorter wait times on Monday and Friday afternoons. The office is located at [address] [place], [place], [place] [address] No appointment is necessary.   Labs are drawn by [facility].  You may receive a bill from [facility] for your lab work.  If you wish to have your labs drawn at another location, please call the office 24 hours in advance to send orders.  If you have any questions regarding directions or hours of operation,  please call [phone].   Just as a reminder please drink plenty of water prior to coming for your lab work. Thanks!

## 2019-06-29 NOTE — Telephone Encounter (Signed)
Received approval for Patient Assistance from [facility] [employer] for Humira. Patient has been approved for assistance through 07/27/2020.  Will send document to scan center.  Phone#[phone]  2:31 PM Beatriz Chancellor, CPhT

## 2019-07-04 ENCOUNTER — Telehealth: Payer: Self-pay | Admitting: Rheumatology

## 2019-07-04 NOTE — Telephone Encounter (Signed)
Patient left a message requesting a call back. Patient is due for labs, and needs those sent to [facility], They are very busy at that lab, and patient needs to know when he should schedule an appointment for labs? Please call to advise.

## 2019-07-05 ENCOUNTER — Telehealth: Payer: Self-pay

## 2019-07-05 ENCOUNTER — Other Ambulatory Visit: Payer: Self-pay

## 2019-07-05 DIAGNOSIS — Z79899 Other long term (current) drug therapy: Secondary | ICD-10-CM

## 2019-07-05 MED ORDER — HUMIRA (2 PEN) 40 MG/0.4ML ~~LOC~~ AJKT: 40.0000 mg | AUTO-INJECTOR | SUBCUTANEOUS | 0 refills | Status: DC

## 2019-07-05 NOTE — Telephone Encounter (Signed)
Patient states he spoke with Dina Rich and has been approved for patient assistance for 2021.

## 2019-07-05 NOTE — Telephone Encounter (Signed)
Patient requested that I contact [facility] for an appointment for labs.   Patient is scheduled for 07/18/2019 at 3:15pm. Orders have been faxed to [phone].   Patient requested refill of humira to be sent to [facility].  Last Visit: 06/14/2019 Next Visit: 11/15/2019 Labs: 04/11/2019 Glucose is elevated. TB Gold: 04/11/2019 negative   Okay to refill per Dr. Estanislado Pandy.

## 2019-07-19 LAB — CBC WITH DIFFERENTIAL/PLATELET
Absolute Monocytes: 793 cells/uL (ref 200–950)
Basophils Absolute: 31 cells/uL (ref 0–200)
Basophils Relative: 0.4 %
Eosinophils Absolute: 108 cells/uL (ref 15–500)
Eosinophils Relative: 1.4 %
HCT: 36 % — ABNORMAL LOW (ref 38.5–50.0)
Hemoglobin: 12.6 g/dL — ABNORMAL LOW (ref 13.2–17.1)
Lymphs Abs: 1709 cells/uL (ref 850–3900)
MCH: 33.7 pg — ABNORMAL HIGH (ref 27.0–33.0)
MCHC: 35 g/dL (ref 32.0–36.0)
MCV: 96.3 fL (ref 80.0–100.0)
MPV: 10.4 fL (ref 7.5–12.5)
Monocytes Relative: 10.3 %
Neutro Abs: 5059 cells/uL (ref 1500–7800)
Neutrophils Relative %: 65.7 %
Platelets: 307 10*3/uL (ref 140–400)
RBC: 3.74 10*6/uL — ABNORMAL LOW (ref 4.20–5.80)
RDW: 12.5 % (ref 11.0–15.0)
Total Lymphocyte: 22.2 %
WBC: 7.7 10*3/uL (ref 3.8–10.8)

## 2019-07-19 LAB — COMPLETE METABOLIC PANEL WITH GFR
AG Ratio: 0.9 (calc) — ABNORMAL LOW (ref 1.0–2.5)
ALT: 14 U/L (ref 9–46)
AST: 23 U/L (ref 10–35)
Albumin: 3.7 g/dL (ref 3.6–5.1)
Alkaline phosphatase (APISO): 158 U/L — ABNORMAL HIGH (ref 35–144)
BUN: 12 mg/dL (ref 7–25)
CO2: 25 mmol/L (ref 20–32)
Calcium: 8.7 mg/dL (ref 8.6–10.3)
Chloride: 95 mmol/L — ABNORMAL LOW (ref 98–110)
Creat: 0.7 mg/dL (ref 0.70–1.11)
GFR, Est African American: 103 mL/min/{1.73_m2} (ref >=60)
GFR, Est Non African American: 89 mL/min/{1.73_m2} (ref >=60)
Globulin: 4.1 g/dL (calc) — ABNORMAL HIGH (ref 1.9–3.7)
Glucose, Bld: 111 mg/dL — ABNORMAL HIGH (ref 65–99)
Potassium: 3.9 mmol/L (ref 3.5–5.3)
Sodium: 132 mmol/L — ABNORMAL LOW (ref 135–146)
Total Bilirubin: 0.7 mg/dL (ref 0.2–1.2)
Total Protein: 7.8 g/dL (ref 6.1–8.1)

## 2019-07-19 NOTE — Progress Notes (Signed)
Mild anemia noted.  Labs are stable.  Alkaline phosphatase is mildly elevated.  We will continue to monitor labs.  Please forward labs to his PCP as well.

## 2019-07-27 ENCOUNTER — Telehealth: Payer: Self-pay

## 2019-07-27 MED ORDER — FOLIC ACID 1 MG PO TABS: 2.0000 mg | ORAL_TABLET | Freq: Every day | ORAL | 2 refills | Status: DC

## 2019-07-27 NOTE — Telephone Encounter (Signed)
Refill request received via fax from [facility] in [place], [place] for folic acid.   Last Visit: 06/14/2019 Next Visit: 11/15/2019  Okay to refill per Dr. Estanislado Pandy.

## 2019-07-29 HISTORY — PX: LUNG SURGERY: SHX703

## 2019-08-02 ENCOUNTER — Telehealth: Payer: Self-pay | Admitting: Rheumatology

## 2019-08-02 NOTE — Telephone Encounter (Signed)
He should not take Humira until he completely recovers from this illness.

## 2019-08-02 NOTE — Telephone Encounter (Addendum)
Patient's wife calling because patient is in [facility] in [place]. Patient started to get severe SOB, and oxygen level dropped to 79. Patient was admitted, and found to have both lungs filled with blood. They were not able to get all the blood out, and patient will be having surgery Thursday to remove coagulated blood. Wife needs to know if patient should take dose of Humira on Saturday as scheduled, or hold for now? Please call to advise.

## 2019-08-02 NOTE — Telephone Encounter (Signed)
How long should patient hold humira? Please advise.

## 2019-08-02 NOTE — Telephone Encounter (Signed)
Advised patient's wife He should not take Humira until he completely recovers from this illness. She verbalized understanding.

## 2019-09-28 ENCOUNTER — Other Ambulatory Visit: Payer: Self-pay | Admitting: Rheumatology

## 2019-09-28 ENCOUNTER — Telehealth: Payer: Self-pay | Admitting: Rheumatology

## 2019-09-28 DIAGNOSIS — Z79899 Other long term (current) drug therapy: Secondary | ICD-10-CM

## 2019-09-28 NOTE — Telephone Encounter (Signed)
Attempted to contact the patient and left message for patient to call the office.  

## 2019-09-28 NOTE — Telephone Encounter (Signed)
Last Visit: 06/14/19 Next Visit: 11/15/19 Labs: 07/04/20 Mild anemia noted. Labs are stable. Alkaline phosphatase is mildly elevated  Patient advised he is due to update las. Lab orders faxed to PCP. Patient will update this week.   Okay to refill 30 day supply per Dr. Corliss Skains

## 2019-09-28 NOTE — Telephone Encounter (Signed)
Patient left a voicemail requesting to speak with you directly regarding his labwork orders.

## 2019-11-07 NOTE — Progress Notes (Deleted)
Office Visit Note  Patient: Billy Hurley             Date of Birth: 1936-04-09           MRN: [id number]             PCP: Olena Mater, MD Referring: Olena Mater, MD Visit Date: 11/15/2019 Occupation: @GUAROCC @  Subjective:  No chief complaint on file.   History of Present Illness: Trenton Passow is a 84 y.o. male ***   Activities of Daily Living:  Patient reports morning stiffness for *** {minute/hour:19697}.   Patient {ACTIONS;DENIES/REPORTS:21021675::"Denies"} nocturnal pain.  Difficulty dressing/grooming: {ACTIONS;DENIES/REPORTS:21021675::"Denies"} Difficulty climbing stairs: {ACTIONS;DENIES/REPORTS:21021675::"Denies"} Difficulty getting out of chair: {ACTIONS;DENIES/REPORTS:21021675::"Denies"} Difficulty using hands for taps, buttons, cutlery, and/or writing: {ACTIONS;DENIES/REPORTS:21021675::"Denies"}  No Rheumatology ROS completed.   PMFS History:  Patient Active Problem List   Diagnosis Date Noted  . Rheumatoid arthritis of multiple sites with negative rheumatoid factor ([place]) 10/28/2016  . High risk medication use 10/28/2016  . Idiopathic chronic gout of multiple sites without tophus 09/17/2016  . Bilateral knee effusions 09/17/2016  . History of gout 08/26/2016  . Primary osteoarthritis of both knees 08/26/2016  . Impingement syndrome of shoulder region 08/26/2016  . Essential hypertension 08/26/2016  . History of hyperglycemia 08/26/2016  . Chronic atrial fibrillation ([place]) 08/26/2016  . Other sleep apnea 08/26/2016    Past Medical History:  Diagnosis Date  . A-fib ([place])   . Gout   . Hypertension     Family History  Problem Relation Age of Onset  . Stroke Brother   . Hypertension Brother   . Hypertension Brother    Past Surgical History:  Procedure Laterality Date  . APPENDECTOMY    . TOTAL SHOULDER ARTHROPLASTY     Social History   Social History Narrative  . Not on file   Immunization History  Administered Date(s) Administered  . DT  (Pediatric) 07/11/2008  . Influenza, High Dose Seasonal PF 06/29/2017, 05/12/2018  . Pneumococcal Conjugate-13 08/03/2015  . Pneumococcal-Unspecified 08/07/2003  . Tetanus 07/11/2008     Objective: Vital Signs: There were no vitals taken for this visit.   Physical Exam   Musculoskeletal Exam: ***  CDAI Exam: CDAI Score: -- Patient Global: --; Provider Global: -- Swollen: --; Tender: -- Joint Exam 11/15/2019   No joint exam has been documented for this visit   There is currently no information documented on the homunculus. Go to the Rheumatology activity and complete the homunculus joint exam.  Investigation: No additional findings.  Imaging: No results found.  Recent Labs: Lab Results  Component Value Date   WBC 7.7 07/05/2019   HGB 12.6 (L) 07/05/2019   PLT 307 07/05/2019   NA 132 (L) 07/05/2019   K 3.9 07/05/2019   CL 95 (L) 07/05/2019   CO2 25 07/05/2019   GLUCOSE 111 (H) 07/05/2019   BUN 12 07/05/2019   CREATININE 0.70 07/05/2019   BILITOT 0.7 07/05/2019   ALKPHOS 84 11/17/2016   AST 23 07/05/2019   ALT 14 07/05/2019   PROT 7.8 07/05/2019   ALBUMIN 3.8 11/17/2016   CALCIUM 8.7 07/05/2019   GFRAA 103 07/05/2019   QFTBGOLDPLUS NEGATIVE 04/11/2019    Speciality Comments: No specialty comments available.  Procedures:  No procedures performed Allergies: Other   Assessment / Plan:     Visit Diagnoses: No diagnosis found.  Orders: No orders of the defined types were placed in this encounter.  No orders of the defined types were placed in this encounter.  Face-to-face time spent with patient was *** minutes. Greater than 50% of time was spent in counseling and coordination of care.  Follow-Up Instructions: No follow-ups on file.   Gearldine Bienenstock, PA-C  Note - This record has been created using Dragon software.  Chart creation errors have been sought, but may not always  have been located. Such creation errors do not reflect on  the standard  of medical care.

## 2019-11-14 ENCOUNTER — Telehealth: Payer: Self-pay | Admitting: *Deleted

## 2019-11-14 NOTE — Telephone Encounter (Signed)
Also received a refill request on MTX. 8 tabs by mouth weekly. If patient has resumed , he will need to update labs.

## 2019-11-14 NOTE — Telephone Encounter (Signed)
Received refill request from [facility]. Attempted to contact the patient to verify if patient has restart Humira or if patient is still holding medication.

## 2019-11-15 ENCOUNTER — Ambulatory Visit: Payer: Medicare Other | Admitting: Rheumatology

## 2019-11-16 ENCOUNTER — Telehealth: Payer: Self-pay | Admitting: Rheumatology

## 2019-11-16 NOTE — Telephone Encounter (Signed)
See previous telephone encounter.

## 2019-11-16 NOTE — Telephone Encounter (Signed)
Attempted to contact patient and left message on machine to advise patient to call the office.  

## 2019-11-16 NOTE — Telephone Encounter (Signed)
Patient left a voicemail requesting a return call to let him know "why Dr. Corliss Skains hasn't okayed his refill of Methotrexate."

## 2019-11-18 ENCOUNTER — Other Ambulatory Visit: Payer: Self-pay | Admitting: Rheumatology

## 2019-11-18 MED ORDER — METHOTREXATE 2.5 MG PO TABS: ORAL_TABLET | ORAL | 0 refills | Status: DC

## 2019-11-18 NOTE — Telephone Encounter (Signed)
Attempted to contact patient and left message on machine for patient to call the office. Advised patient we have been trying to reach him to discuss medication.

## 2019-11-18 NOTE — Telephone Encounter (Signed)
I reviewed labs in care everywhere.  Okay to refill methotrexate.  Next labs should be due in July.

## 2019-11-18 NOTE — Addendum Note (Signed)
Addended by: Geroge Baseman C on: 11/18/2019 10:00 AM   Modules accepted: Orders

## 2019-11-18 NOTE — Telephone Encounter (Signed)
Patient left a voicemail yesterday, 11/17/19 at 7:04 pm "wondering why you haven't refilled my Methotrexate.  I'm running out of it.  Will you please give me a call."  Phone #[phone]

## 2019-11-18 NOTE — Telephone Encounter (Signed)
Spoke with patient and patient has been taking methotrexate on schedule, 8 tablets once weekly. Patient is not taking humira and has not taken it since prior to the hospital admission in January. Patient is requesting a refill of methotrexate.   Last Visit: 06/14/2019  Next Visit: message sent to the front desk to schedule.  Labs: 11/04/2019 (in care everywhere) RBC 3.59, hemoglobin 9.3, hematocrit 30.7, MCHC 30.3, RDW 18, sodium 134, creatinine 0.54, albumin 3.1, absolute lymph 1.28.  Okay to refill methotrexate?

## 2019-11-30 ENCOUNTER — Telehealth: Payer: Self-pay | Admitting: Rheumatology

## 2019-11-30 NOTE — Telephone Encounter (Signed)
Erica from [facility] [employer] called stating they are closing patient's case since patient has been approved through 07/27/20.  If you have any questions, please call back at #[phone]

## 2019-11-30 NOTE — Telephone Encounter (Signed)
Noted! Thank you

## 2020-01-06 NOTE — Progress Notes (Signed)
Office Visit Note  Patient: Billy Hurley             Date of Birth: 22-Jul-1936           MRN: [id number]             PCP: Christena Flake, MD Referring: Christena Flake, MD Visit Date: 01/09/2020 Occupation: @GUAROCC @  Subjective:  Pain in multiple joints   History of Present Illness: Billy Hurley is a 84 y.o. male with history of seronegative rheumatoid arthritis and gouty arthropathy.  He was hospitalized in January after pleural effusion which developed into empyema.  I reviewed Dr. February note and got information from there.  After being in intensive care unit he was discharged to rehab unit.  He gradually recovered.  Humira was discontinued and he stayed on methotrexate 8 tablets/week.  He states since he has been off Humira he has been having intermittent flares.  The flare could be all over.  He has a stiffness and difficulty moving.  Is having a lot of discomfort in his shoulders.  He had some leftover prednisone from 2018.  He took 2 5 mg tablets and the symptoms improved.  (Patient was recently in the hospital and then the nursing home for rehab. He was admitted to 2019 on 07/30/2019 with pleural effusion. Found out that he had bled into his lung. He required 3 operations first to put chest tube and then a lap procedure to drain the loculated effusion. He did develop an empyema grew out Enterococcus faecalis. He still is on Augmentin for this. He was told by one doctor at 09/27/2019 to take this until 09/25/19. Was told by another physician to take for 10 days. He does have some loose stools. He is taking MiraLAX every day. He salt tabs for hyponatremia. He is also on a fluid restriction. Has a history of rheumatoid arthritis. Was on Humira but has stopped this. He is still taking methotrexate. His joints are doing okay at the present. His next appointment with his rheumatologist is several months away. Patient did have elevated INR of 6 when he was admitted to the hospital. His  Coumadin was held and he was given plasma. He was eventually put on Eliquis because of his atrial fibrillation. He has had no bleeding he knows of since he has been on the Eliquis. He was told that his INR was still elevated at 1.4 on the Eliquis. He does have an appointment to see me for an annual wellness visit next week. He still has some redness around his buttocks. It was getting better on a statin cream but is not doing better now since he has been home his wife has been putting the cream on him. He would like to have his blood work done in anticipation of his annual wellness visit next week. He is fasting. Patient's oxygen status was checked. At rest his O2 sat was 96%. His O2 sat with walking dropped to 88%. It stayed above 90% on 2 L with walking. He does have sleep apnea and wears CPAP at night. Patient was admitted to [facility] on 07/30/2019. [facility] on [facility]/14/2019. He was discharged home from the rehab center on 08/25/2019. His med list has been reconciled.)-Dr. 08/27/2019   Activities of Daily Living:  Patient reports morning stiffness for 24 hours.   Patient Denies nocturnal pain.  Difficulty dressing/grooming: Denies Difficulty climbing stairs: Reports Difficulty getting out of chair: Denies Difficulty  using hands for taps, buttons, cutlery, and/or writing: Denies  Review of Systems  Constitutional: Positive for fatigue. Negative for night sweats.  HENT: Positive for mouth dryness. Negative for mouth sores and nose dryness.   Eyes: Negative for redness and dryness.  Respiratory: Negative for shortness of breath and difficulty breathing.   Cardiovascular: Positive for swelling in legs/feet. Negative for chest pain, palpitations, hypertension and irregular heartbeat.  Gastrointestinal: Positive for constipation. Negative for diarrhea.  Endocrine: Negative for increased urination.  Genitourinary: Negative for difficulty  urinating.  Musculoskeletal: Positive for arthralgias, joint pain and morning stiffness. Negative for joint swelling, myalgias, muscle weakness, muscle tenderness and myalgias.  Skin: Negative for color change, rash, hair loss, nodules/bumps, skin tightness, ulcers and sensitivity to sunlight.  Allergic/Immunologic: Negative for susceptible to infections.  Neurological: Positive for weakness ( ). Negative for dizziness, fainting, headaches, memory loss and night sweats.  Hematological: Negative for swollen glands.  Psychiatric/Behavioral: Negative for depressed mood and sleep disturbance. The patient is not nervous/anxious.     PMFS History:  Patient Active Problem List   Diagnosis Date Noted   Rheumatoid arthritis of multiple sites with negative rheumatoid factor (HCC) 10/28/2016   High risk medication use 10/28/2016   Idiopathic chronic gout of multiple sites without tophus 09/17/2016   Bilateral knee effusions 09/17/2016   History of gout 08/26/2016   Primary osteoarthritis of both knees 08/26/2016   Impingement syndrome of shoulder region 08/26/2016   Essential hypertension 08/26/2016   History of hyperglycemia 08/26/2016   Chronic atrial fibrillation (HCC) 08/26/2016   Other sleep apnea 08/26/2016    Past Medical History:  Diagnosis Date   A-fib (HCC)    Gout    Hypertension     Family History  Problem Relation Age of Onset   Stroke Brother    Hypertension Brother    Hypertension Brother    Past Surgical History:  Procedure Laterality Date   APPENDECTOMY     LUNG SURGERY  07/2019   fluid removal   TOTAL SHOULDER ARTHROPLASTY     Social History   Social History Narrative   Not on file   Immunization History  Administered Date(s) Administered   DT (Pediatric) 07/11/2008   Influenza, High Dose Seasonal PF 06/29/2017, 05/12/2018   Pneumococcal Conjugate-13 08/03/2015   Pneumococcal-Unspecified 08/07/2003   Tetanus 07/11/2008      Objective: Vital Signs: BP (!) 123/56 (BP Location: Left Arm, Patient Position: Sitting, Cuff Size: Normal)    Pulse 69    Resp 16    Ht 5\' 8"  (1.727 m)    Wt 170 lb 3.2 oz (77.2 kg)    BMI 25.88 kg/m    Physical Exam Vitals and nursing note reviewed.  Constitutional:      Appearance: He is well-developed.  HENT:     Head: Normocephalic and atraumatic.  Eyes:     Conjunctiva/sclera: Conjunctivae normal.     Pupils: Pupils are equal, round, and reactive to light.  Cardiovascular:     Rate and Rhythm: Normal rate and regular rhythm.     Heart sounds: Normal heart sounds.  Pulmonary:     Effort: Pulmonary effort is normal.     Breath sounds: Normal breath sounds.  Abdominal:     General: Bowel sounds are normal.     Palpations: Abdomen is soft.  Musculoskeletal:     Cervical back: Normal range of motion and neck supple.  Skin:    General: Skin is warm and dry.  Capillary Refill: Capillary refill takes less than 2 seconds.  Neurological:     Mental Status: He is alert and oriented to person, place, and time.  Psychiatric:        Behavior: Behavior normal.      Musculoskeletal Exam: C-spine was a limited range of motion.  Lumbar spine was limited range of motion.  Shoulder joints, elbow joints, wrist joints with good range of motion.  There was no tenderness on palpation over MCPs.  PIP and DIP thickening was noted.  Hip joints, knee joints, ankles, MTPs PIPs and DIPs with good range of motion with no synovitis.  CDAI Exam: CDAI Score: 0.6  Patient Global: 3 mm; Provider Global: 3 mm Swollen: 0 ; Tender: 0  Joint Exam 01/09/2020   No joint exam has been documented for this visit   There is currently no information documented on the homunculus. Go to the Rheumatology activity and complete the homunculus joint exam.  Investigation: No additional findings.  Imaging: No results found.  Recent Labs: Lab Results  Component Value Date   WBC 7.7 07/05/2019   HGB 12.6  (L) 07/05/2019   PLT 307 07/05/2019   NA 132 (L) 07/05/2019   K 3.9 07/05/2019   CL 95 (L) 07/05/2019   CO2 25 07/05/2019   GLUCOSE 111 (H) 07/05/2019   BUN 12 07/05/2019   CREATININE 0.70 07/05/2019   BILITOT 0.7 07/05/2019   ALKPHOS 84 11/17/2016   AST 23 07/05/2019   ALT 14 07/05/2019   PROT 7.8 07/05/2019   ALBUMIN 3.8 11/17/2016   CALCIUM 8.7 07/05/2019   GFRAA 103 07/05/2019   QFTBGOLDPLUS NEGATIVE 04/11/2019    Speciality Comments: No specialty comments available.  Procedures:  No procedures performed Allergies: Patient has no known allergies.   Assessment / Plan:     Visit Diagnoses: Rheumatoid arthritis of multiple sites with negative rheumatoid factor (HCC)-patient has no synovitis on examination today.  Humira was stopped in January after he developed empyema.  Methotrexate was continued.  He has been taking methotrexate 8 tablets p.o. weekly.  He had no synovitis on examination.  His wife reports that he had a mild flare one time when she gave him prednisone 5 mg 2 tablets from 2018.  We are detailed discussion today.  He had no synovitis and I do not think any additional medication is needed at this time.  But in case he develops increased pain and swelling the option could be to add hydroxychloroquine or leflunomide to the regimen.  High risk medication use -(Humira 40 mg every 21 days was discontinued due to empyema developed in January 2021), methotrexate 8 tablets every 7 days, and folic acid 1 mg 2 tablets daily.  Last TB gold negative on 04/11/2019 and will monitor yearl - Plan: CBC with Differential/Platelet, COMPLETE METABOLIC PANEL WITH GFR today and every 3 months.  Idiopathic chronic gout of multiple sites without tophus - allopurinol 100 mg p.o. daily.  I will check uric acid level today.  Primary osteoarthritis of both knees-he has chronic discomfort in his knee joints.  DDD (degenerative disc disease), cervical-he has limited range of motion of cervical  spine.  Impingement syndrome of shoulder region, unspecified laterality  Essential hypertension  Chronic atrial fibrillation (HCC)  Empyema lung [facility]) - January 2021  Orders: Orders Placed This Encounter  Procedures   CBC with Differential/Platelet   COMPLETE METABOLIC PANEL WITH GFR   Uric acid   No orders of the defined types were placed in  this encounter.     Follow-Up Instructions: Return in about 3 months (around 04/10/2020) for Rheumatoid arthritis, Osteoarthritis, Gout.   Bo Merino, MD  Note - This record has been created using Editor, commissioning.  Chart creation errors have been sought, but may not always  have been located. Such creation errors do not reflect on  the standard of medical care.

## 2020-01-09 ENCOUNTER — Encounter: Payer: Self-pay | Admitting: Rheumatology

## 2020-01-09 ENCOUNTER — Other Ambulatory Visit: Payer: Self-pay

## 2020-01-09 ENCOUNTER — Ambulatory Visit (INDEPENDENT_AMBULATORY_CARE_PROVIDER_SITE_OTHER): Payer: Medicare Other | Admitting: Rheumatology

## 2020-01-09 VITALS — BP 123/56 | HR 69 | Resp 16 | Ht 68.0 in | Wt 170.2 lb

## 2020-01-09 DIAGNOSIS — Z79899 Other long term (current) drug therapy: Secondary | ICD-10-CM

## 2020-01-09 DIAGNOSIS — M5030 Other cervical disc degeneration, unspecified cervical region: Secondary | ICD-10-CM

## 2020-01-09 DIAGNOSIS — M1A09X0 Idiopathic chronic gout, multiple sites, without tophus (tophi): Secondary | ICD-10-CM

## 2020-01-09 DIAGNOSIS — I10 Essential (primary) hypertension: Secondary | ICD-10-CM

## 2020-01-09 DIAGNOSIS — M0609 Rheumatoid arthritis without rheumatoid factor, multiple sites: Secondary | ICD-10-CM

## 2020-01-09 DIAGNOSIS — I482 Chronic atrial fibrillation, unspecified: Secondary | ICD-10-CM

## 2020-01-09 DIAGNOSIS — J869 Pyothorax without fistula: Secondary | ICD-10-CM

## 2020-01-09 DIAGNOSIS — M170 Bilateral primary osteoarthritis of knee: Secondary | ICD-10-CM | POA: Diagnosis not present

## 2020-01-09 DIAGNOSIS — M7540 Impingement syndrome of unspecified shoulder: Secondary | ICD-10-CM

## 2020-01-09 NOTE — Patient Instructions (Addendum)
Standing Labs We placed an order today for your standing lab work.    Please come back and get your standing labs in September and every 3 months   We have open lab daily Monday through Thursday from 8:30-12:30 PM and 1:30-4:30 PM and Friday from 8:30-12:30 PM and 1:30-4:00 PM at the office of Dr. Milayah Krell.   You may experience shorter wait times on Monday and Friday afternoons. The office is located at [address], [address], [place], [place] [address] No appointment is necessary.   Labs are drawn by [facility].  You may receive a bill from [facility] for your lab work.  If you wish to have your labs drawn at another location, please call the office 24 hours in advance to send orders.  If you have any questions regarding directions or hours of operation,  please call [phone].   Just as a reminder please drink plenty of water prior to coming for your lab work. Thanks!   

## 2020-01-10 LAB — CBC WITH DIFFERENTIAL/PLATELET
Absolute Monocytes: 537 cells/uL (ref 200–950)
Basophils Absolute: 27 cells/uL (ref 0–200)
Basophils Relative: 0.4 %
Eosinophils Absolute: 82 cells/uL (ref 15–500)
Eosinophils Relative: 1.2 %
HCT: 29.8 % — ABNORMAL LOW (ref 38.5–50.0)
Hemoglobin: 9.4 g/dL — ABNORMAL LOW (ref 13.2–17.1)
Lymphs Abs: 1632 cells/uL (ref 850–3900)
MCH: 26 pg — ABNORMAL LOW (ref 27.0–33.0)
MCHC: 31.5 g/dL — ABNORMAL LOW (ref 32.0–36.0)
MCV: 82.3 fL (ref 80.0–100.0)
MPV: 9.1 fL (ref 7.5–12.5)
Monocytes Relative: 7.9 %
Neutro Abs: 4522 cells/uL (ref 1500–7800)
Neutrophils Relative %: 66.5 %
Platelets: 386 10*3/uL (ref 140–400)
RBC: 3.62 10*6/uL — ABNORMAL LOW (ref 4.20–5.80)
RDW: 18.2 % — ABNORMAL HIGH (ref 11.0–15.0)
Total Lymphocyte: 24 %
WBC: 6.8 10*3/uL (ref 3.8–10.8)

## 2020-01-10 LAB — COMPLETE METABOLIC PANEL WITH GFR
AG Ratio: 0.7 (calc) — ABNORMAL LOW (ref 1.0–2.5)
ALT: 12 U/L (ref 9–46)
AST: 23 U/L (ref 10–35)
Albumin: 3.4 g/dL — ABNORMAL LOW (ref 3.6–5.1)
Alkaline phosphatase (APISO): 148 U/L — ABNORMAL HIGH (ref 35–144)
BUN/Creatinine Ratio: 22 (calc) (ref 6–22)
BUN: 15 mg/dL (ref 7–25)
CO2: 29 mmol/L (ref 20–32)
Calcium: 9.2 mg/dL (ref 8.6–10.3)
Chloride: 100 mmol/L (ref 98–110)
Creat: 0.69 mg/dL — ABNORMAL LOW (ref 0.70–1.11)
GFR, Est African American: 102 mL/min/{1.73_m2} (ref >=60)
GFR, Est Non African American: 88 mL/min/{1.73_m2} (ref >=60)
Globulin: 4.8 g/dL (calc) — ABNORMAL HIGH (ref 1.9–3.7)
Glucose, Bld: 103 mg/dL — ABNORMAL HIGH (ref 65–99)
Potassium: 4.2 mmol/L (ref 3.5–5.3)
Sodium: 138 mmol/L (ref 135–146)
Total Bilirubin: 0.6 mg/dL (ref 0.2–1.2)
Total Protein: 8.2 g/dL — ABNORMAL HIGH (ref 6.1–8.1)

## 2020-01-10 LAB — URIC ACID: Uric Acid, Serum: 5.6 mg/dL (ref 4.0–8.0)

## 2020-01-10 NOTE — Progress Notes (Signed)
Significant anemia noted.  Patient was hospitalized in January with empyema.  Please forward labs to his PCP and notify patient.  LFTs and creatinine are stable.

## 2020-02-06 ENCOUNTER — Other Ambulatory Visit: Payer: Self-pay

## 2020-02-06 DIAGNOSIS — Z79899 Other long term (current) drug therapy: Secondary | ICD-10-CM

## 2020-02-06 MED ORDER — METHOTREXATE 2.5 MG PO TABS: ORAL_TABLET | ORAL | 0 refills | Status: DC

## 2020-02-06 NOTE — Telephone Encounter (Signed)
Please advise the patient to have repeat lab work drawn this week to see if his lab results have improved.

## 2020-02-06 NOTE — Telephone Encounter (Signed)
Advised patient to have repeat lab work drawn this week to see if his lab results have improved. Patient verbalized understanding and lab orders have been faxed to Dr. Dortha Kern office.

## 2020-02-06 NOTE — Telephone Encounter (Signed)
Refill request received via fax from [facility] in [place], [place].   Last Visit: 01/09/2020  Next Visit: 04/10/2020  Labs: 01/09/2020 Significant anemia noted. Patient was hospitalized in January with empyema.LFTs and creatinine are stable.  Current Dose per office note on 01/09/2020: methotrexate 8 tablets every 7 days, DX: Rheumatoid arthritis of multiple sites with negative rheumatoid factor  Okay to refill methotrexate?

## 2020-02-16 ENCOUNTER — Other Ambulatory Visit: Payer: Self-pay | Admitting: Rheumatology

## 2020-02-17 LAB — CBC WITH DIFFERENTIAL/PLATELET
Absolute Monocytes: 660 cells/uL (ref 200–950)
Basophils Absolute: 53 cells/uL (ref 0–200)
Basophils Relative: 0.7 %
Eosinophils Absolute: 120 cells/uL (ref 15–500)
Eosinophils Relative: 1.6 %
HCT: 30.7 % — ABNORMAL LOW (ref 38.5–50.0)
Hemoglobin: 10 g/dL — ABNORMAL LOW (ref 13.2–17.1)
Lymphs Abs: 1388 cells/uL (ref 850–3900)
MCH: 27.9 pg (ref 27.0–33.0)
MCHC: 32.6 g/dL (ref 32.0–36.0)
MCV: 85.5 fL (ref 80.0–100.0)
MPV: 9.7 fL (ref 7.5–12.5)
Monocytes Relative: 8.8 %
Neutro Abs: 5280 cells/uL (ref 1500–7800)
Neutrophils Relative %: 70.4 %
Platelets: 421 10*3/uL — ABNORMAL HIGH (ref 140–400)
RBC: 3.59 10*6/uL — ABNORMAL LOW (ref 4.20–5.80)
RDW: 17.9 % — ABNORMAL HIGH (ref 11.0–15.0)
Total Lymphocyte: 18.5 %
WBC: 7.5 10*3/uL (ref 3.8–10.8)

## 2020-02-17 LAB — COMPLETE METABOLIC PANEL WITH GFR
AG Ratio: 0.7 (calc) — ABNORMAL LOW (ref 1.0–2.5)
ALT: 8 U/L — ABNORMAL LOW (ref 9–46)
AST: 15 U/L (ref 10–35)
Albumin: 3.4 g/dL — ABNORMAL LOW (ref 3.6–5.1)
Alkaline phosphatase (APISO): 120 U/L (ref 35–144)
BUN/Creatinine Ratio: 22 (calc) (ref 6–22)
BUN: 12 mg/dL (ref 7–25)
CO2: 27 mmol/L (ref 20–32)
Calcium: 9.1 mg/dL (ref 8.6–10.3)
Chloride: 98 mmol/L (ref 98–110)
Creat: 0.55 mg/dL — ABNORMAL LOW (ref 0.70–1.11)
GFR, Est African American: 112 mL/min/{1.73_m2} (ref >=60)
GFR, Est Non African American: 97 mL/min/{1.73_m2} (ref >=60)
Globulin: 4.7 g/dL (calc) — ABNORMAL HIGH (ref 1.9–3.7)
Glucose, Bld: 112 mg/dL — ABNORMAL HIGH (ref 65–99)
Potassium: 4.2 mmol/L (ref 3.5–5.3)
Sodium: 136 mmol/L (ref 135–146)
Total Bilirubin: 0.7 mg/dL (ref 0.2–1.2)
Total Protein: 8.1 g/dL (ref 6.1–8.1)

## 2020-02-27 ENCOUNTER — Other Ambulatory Visit: Payer: Self-pay | Admitting: Rheumatology

## 2020-02-27 NOTE — Telephone Encounter (Signed)
Last Visit: 01/09/2020  Next Visit: 04/10/2020   Okay to refill per Dr. Corliss Skains

## 2020-03-06 ENCOUNTER — Telehealth: Payer: Self-pay | Admitting: Rheumatology

## 2020-03-06 NOTE — Telephone Encounter (Signed)
Patient's wife Billy Hurley called stating Billy Hurley was on antibiotics for the past 2 months due to having fluid in his lungs.  Billy Hurley states he discontinued his Humira, but has continued taking Methotrexate.  Billy Hurley states he now has a "yeast infection" on his backside which is not healing and she is worried that the Methotrexate might be lowering his ability to heal correctly.  Patient wants Dr. Fatima Sanger opinion and requested a return call.

## 2020-03-06 NOTE — Telephone Encounter (Signed)
I called Steward Drone, patient's wife and discussed with her that he needs to stop methotrexate along with Humira when he has an infection.  He should not take any of these medications until he completely recovers from the infection.  For that he will need approval from his primary care doctor.  Steward Drone said that for future phone calls please do not call us cell number as he cannot answer the phone call.  She came me her phone number which is [phone].  Please update the information on the chart.

## 2020-03-07 NOTE — Telephone Encounter (Signed)
Phone number was updated in his chart.

## 2020-03-29 NOTE — Progress Notes (Signed)
Office Visit Note  Patient: Billy Hurley             Date of Birth: Dec 11, 1935           MRN: [id number]             PCP: Christena Flake, MD Referring: Christena Flake, MD Visit Date: 04/10/2020 Occupation: @GUAROCC @  Subjective: Medication Management   History of Present Illness: Billy Hurley is a 84 y.o. male with history of rheumatoid arthritis and gout.  He denies any joint swelling.  He has some pedal edema off and on.  Denies having any gout flare.  He states he has been experiencing a lot of fatigue for the last few months.  He had evaluation by hematologist for low hemoglobin.  He also saw his PCP recently.  He has an appointment coming up with the cardiologist.  He came off Humira after he developed empyema in January.  As he was having a gluteal rash her methotrexate was also discontinued.  He has not noticed any worsening of his symptoms coming off the medications.  Activities of Daily Living:  Patient reports morning stiffness for 0 minute.   Patient Denies nocturnal pain.  Difficulty dressing/grooming: Denies Difficulty climbing stairs: Reports Difficulty getting out of chair: Denies Difficulty using hands for taps, buttons, cutlery, and/or writing: Reports  Review of Systems  Constitutional: Positive for fatigue.  HENT: Positive for mouth dryness. Negative for mouth sores and nose dryness.   Eyes: Negative for pain, itching, visual disturbance and dryness.  Respiratory: Negative for shortness of breath and difficulty breathing.   Cardiovascular: Negative for chest pain, palpitations and swelling in legs/feet.  Gastrointestinal: Positive for constipation. Negative for blood in stool and diarrhea.  Endocrine: Negative for increased urination.  Genitourinary: Negative for difficulty urinating.  Musculoskeletal: Positive for arthralgias and joint pain. Negative for joint swelling, myalgias, muscle weakness, muscle tenderness and myalgias.  Skin: Positive for rash.  Negative for color change, hair loss and redness.  Allergic/Immunologic: Negative for susceptible to infections.  Neurological: Positive for weakness. Negative for dizziness and memory loss.  Hematological: Negative for bruising/bleeding tendency.  Psychiatric/Behavioral: Negative for confusion and sleep disturbance.    PMFS History:  Patient Active Problem List   Diagnosis Date Noted  . Rheumatoid arthritis of multiple sites with negative rheumatoid factor (HCC) 10/28/2016  . High risk medication use 10/28/2016  . Idiopathic chronic gout of multiple sites without tophus 09/17/2016  . Bilateral knee effusions 09/17/2016  . History of gout 08/26/2016  . Primary osteoarthritis of both knees 08/26/2016  . Impingement syndrome of shoulder region 08/26/2016  . Essential hypertension 08/26/2016  . History of hyperglycemia 08/26/2016  . Chronic atrial fibrillation (HCC) 08/26/2016  . Other sleep apnea 08/26/2016    Past Medical History:  Diagnosis Date  . A-fib (HCC)   . Gout   . Hypertension     Family History  Problem Relation Age of Onset  . Stroke Brother   . Hypertension Brother   . Hypertension Brother    Past Surgical History:  Procedure Laterality Date  . APPENDECTOMY    . LUNG SURGERY  07/2019   fluid removal  . TOTAL SHOULDER ARTHROPLASTY     Social History   Social History Narrative  . Not on file   Immunization History  Administered Date(s) Administered  . DT (Pediatric) 07/11/2008  . Influenza, High Dose Seasonal PF 06/29/2017, 05/12/2018  . PFIZER SARS-COV-2 Vaccination 08/23/2019, 09/12/2019  . Pneumococcal Conjugate-13 08/03/2015  .  Pneumococcal-Unspecified 08/07/2003  . Tetanus 07/11/2008     Objective: Vital Signs: BP 133/68 (BP Location: Left Arm, Patient Position: Sitting, Cuff Size: Small)   Pulse 89   Resp 16   Ht 5\' 8"  (1.727 m)   Wt 176 lb 9.6 oz (80.1 kg)   BMI 26.85 kg/m    Physical Exam Vitals and nursing note reviewed.    Constitutional:      Appearance: He is well-developed.  HENT:     Head: Normocephalic and atraumatic.  Eyes:     Conjunctiva/sclera: Conjunctivae normal.     Pupils: Pupils are equal, round, and reactive to light.  Cardiovascular:     Rate and Rhythm: Normal rate and regular rhythm.     Heart sounds: Normal heart sounds.  Pulmonary:     Effort: Pulmonary effort is normal.     Breath sounds: Normal breath sounds.  Abdominal:     General: Bowel sounds are normal.     Palpations: Abdomen is soft.  Musculoskeletal:     Cervical back: Normal range of motion and neck supple.  Skin:    General: Skin is warm and dry.     Capillary Refill: Capillary refill takes less than 2 seconds.  Neurological:     Mental Status: He is alert and oriented to person, place, and time.  Psychiatric:        Behavior: Behavior normal.      Musculoskeletal Exam: C-spine was in good range of motion.  Shoulder joints, elbow joints, wrist joints with good range of motion.  He has some MCP thickening but no synovitis.  PIP and DIP prominence was noted.  Hip joints and knee joints with good range of motion with no synovitis.  There was no tenderness over MTPs.  CDAI Exam: CDAI Score: 0  Patient Global: 0 mm; Provider Global: 0 mm Swollen: 0 ; Tender: 0  Joint Exam 04/10/2020   No joint exam has been documented for this visit   There is currently no information documented on the homunculus. Go to the Rheumatology activity and complete the homunculus joint exam.  Investigation: No additional findings.  Imaging: No results found.  Recent Labs: Lab Results  Component Value Date   WBC 7.5 02/16/2020   HGB 10.0 (L) 02/16/2020   PLT 421 (H) 02/16/2020   NA 136 02/16/2020   K 4.2 02/16/2020   CL 98 02/16/2020   CO2 27 02/16/2020   GLUCOSE 112 (H) 02/16/2020   BUN 12 02/16/2020   CREATININE 0.55 (L) 02/16/2020   BILITOT 0.7 02/16/2020   ALKPHOS 84 11/17/2016   AST 15 02/16/2020   ALT 8 (L)  02/16/2020   PROT 8.1 02/16/2020   ALBUMIN 3.8 11/17/2016   CALCIUM 9.1 02/16/2020   GFRAA 112 02/16/2020   QFTBGOLDPLUS NEGATIVE 04/11/2019    Speciality Comments: No specialty comments available.  Procedures:  No procedures performed Allergies: Patient has no known allergies.   Assessment / Plan:     Visit Diagnoses: Rheumatoid arthritis of multiple sites with negative rheumatoid factor (HCC)-he had no synovitis on my examination.  He came off Humira in January after he developed empyema.  He came off methotrexate few months back as he had gluteal rash which was not healing.  He denies any joint discomfort.  I have advised him to contact February in case he develops any increased joint swelling or pain.  High risk medication use - (Humira 40 mg every 21 days was discontinued due to empyema developed in January  2021), methotrexate 8 tablets every 7 days, and folic acid 1 mg 2 tablets daily   Fatigue-he complains of increased fatigue.  He had no muscular weakness or tenderness.  He is anemic.  He states that he had thorough work-up by a hematologist.  Idiopathic chronic gout of multiple sites without tophus-his uric acid is in the desirable range.  He denies any gout flares.  Primary osteoarthritis of both knees-currently not having much discomfort.  DDD (degenerative disc disease), cervical-good range of motion of his cervical spine.  Impingement syndrome of shoulder region, unspecified laterality  Essential hypertension  Chronic atrial fibrillation (HCC)  Empyema lung (HCC)  Educated about COVID-19 virus infection-he is fully vaccinated against COVID-19.  Have advised him to get booster once available to him.  Use of monoclonal antibody was also discussed in case he develops COVID-19 infection.  He was advised to continue to wear mask, practice social distancing and hand hygiene.  Orders: No orders of the defined types were placed in this encounter.  No orders of the defined types  were placed in this encounter.    Follow-Up Instructions: Return in about 6 months (around 10/08/2020) for Rheumatoid arthritis, Gout, Osteoarthritis.   Pollyann Savoy, MD  Note - This record has been created using Animal nutritionist.  Chart creation errors have been sought, but may not always  have been located. Such creation errors do not reflect on  the standard of medical care.

## 2020-04-10 ENCOUNTER — Encounter: Payer: Self-pay | Admitting: Rheumatology

## 2020-04-10 ENCOUNTER — Telehealth: Payer: Self-pay | Admitting: Rheumatology

## 2020-04-10 ENCOUNTER — Other Ambulatory Visit: Payer: Self-pay

## 2020-04-10 ENCOUNTER — Ambulatory Visit (INDEPENDENT_AMBULATORY_CARE_PROVIDER_SITE_OTHER): Payer: Medicare Other | Admitting: Rheumatology

## 2020-04-10 VITALS — BP 133/68 | HR 89 | Resp 16 | Ht 68.0 in | Wt 176.6 lb

## 2020-04-10 DIAGNOSIS — M5030 Other cervical disc degeneration, unspecified cervical region: Secondary | ICD-10-CM

## 2020-04-10 DIAGNOSIS — Z79899 Other long term (current) drug therapy: Secondary | ICD-10-CM

## 2020-04-10 DIAGNOSIS — M1A09X0 Idiopathic chronic gout, multiple sites, without tophus (tophi): Secondary | ICD-10-CM

## 2020-04-10 DIAGNOSIS — M7540 Impingement syndrome of unspecified shoulder: Secondary | ICD-10-CM

## 2020-04-10 DIAGNOSIS — J869 Pyothorax without fistula: Secondary | ICD-10-CM

## 2020-04-10 DIAGNOSIS — M170 Bilateral primary osteoarthritis of knee: Secondary | ICD-10-CM | POA: Diagnosis not present

## 2020-04-10 DIAGNOSIS — R5383 Other fatigue: Secondary | ICD-10-CM

## 2020-04-10 DIAGNOSIS — I10 Essential (primary) hypertension: Secondary | ICD-10-CM

## 2020-04-10 DIAGNOSIS — Z7189 Other specified counseling: Secondary | ICD-10-CM

## 2020-04-10 DIAGNOSIS — M0609 Rheumatoid arthritis without rheumatoid factor, multiple sites: Secondary | ICD-10-CM | POA: Diagnosis not present

## 2020-04-10 DIAGNOSIS — I482 Chronic atrial fibrillation, unspecified: Secondary | ICD-10-CM

## 2020-04-10 NOTE — Telephone Encounter (Signed)
Wife calling because patient forgot to get a note so patient can get COVID booster. Wife request for note to be mailed to the home address.

## 2020-04-11 ENCOUNTER — Encounter: Payer: Self-pay | Admitting: *Deleted

## 2020-04-11 NOTE — Telephone Encounter (Signed)
Letter written and mailed to patient.  

## 2020-09-25 NOTE — Progress Notes (Deleted)
Office Visit Note  Patient: Billy Hurley             Date of Birth: 01-11-1936           MRN: [id number]             PCP: Christena Flake, MD Referring: Christena Flake, MD Visit Date: 10/09/2020 Occupation: @GUAROCC @  Subjective:  No chief complaint on file.   History of Present Illness: Billy Hurley is a 85 y.o. male ***   Activities of Daily Living:  Patient reports morning stiffness for *** {minute/hour:19697}.   Patient {ACTIONS;DENIES/REPORTS:21021675::"Denies"} nocturnal pain.  Difficulty dressing/grooming: {ACTIONS;DENIES/REPORTS:21021675::"Denies"} Difficulty climbing stairs: {ACTIONS;DENIES/REPORTS:21021675::"Denies"} Difficulty getting out of chair: {ACTIONS;DENIES/REPORTS:21021675::"Denies"} Difficulty using hands for taps, buttons, cutlery, and/or writing: {ACTIONS;DENIES/REPORTS:21021675::"Denies"}  No Rheumatology ROS completed.   PMFS History:  Patient Active Problem List   Diagnosis Date Noted  . Rheumatoid arthritis of multiple sites with negative rheumatoid factor (HCC) 10/28/2016  . High risk medication use 10/28/2016  . Idiopathic chronic gout of multiple sites without tophus 09/17/2016  . Bilateral knee effusions 09/17/2016  . History of gout 08/26/2016  . Primary osteoarthritis of both knees 08/26/2016  . Impingement syndrome of shoulder region 08/26/2016  . Essential hypertension 08/26/2016  . History of hyperglycemia 08/26/2016  . Chronic atrial fibrillation (HCC) 08/26/2016  . Other sleep apnea 08/26/2016    Past Medical History:  Diagnosis Date  . A-fib (HCC)   . Gout   . Hypertension     Family History  Problem Relation Age of Onset  . Stroke Brother   . Hypertension Brother   . Hypertension Brother    Past Surgical History:  Procedure Laterality Date  . APPENDECTOMY    . LUNG SURGERY  07/2019   fluid removal  . TOTAL SHOULDER ARTHROPLASTY     Social History   Social History Narrative  . Not on file   Immunization History   Administered Date(s) Administered  . DT (Pediatric) 07/11/2008  . Influenza, High Dose Seasonal PF 06/29/2017, 05/12/2018  . PFIZER(Purple Top)SARS-COV-2 Vaccination 08/23/2019, 09/12/2019  . Pneumococcal Conjugate-13 08/03/2015  . Pneumococcal-Unspecified 08/07/2003  . Tetanus 07/11/2008     Objective: Vital Signs: There were no vitals taken for this visit.   Physical Exam   Musculoskeletal Exam: ***  CDAI Exam: CDAI Score: -- Patient Global: --; Provider Global: -- Swollen: --; Tender: -- Joint Exam 10/09/2020   No joint exam has been documented for this visit   There is currently no information documented on the homunculus. Go to the Rheumatology activity and complete the homunculus joint exam.  Investigation: No additional findings.  Imaging: No results found.  Recent Labs: Lab Results  Component Value Date   WBC 7.5 02/16/2020   HGB 10.0 (L) 02/16/2020   PLT 421 (H) 02/16/2020   NA 136 02/16/2020   K 4.2 02/16/2020   CL 98 02/16/2020   CO2 27 02/16/2020   GLUCOSE 112 (H) 02/16/2020   BUN 12 02/16/2020   CREATININE 0.55 (L) 02/16/2020   BILITOT 0.7 02/16/2020   ALKPHOS 84 11/17/2016   AST 15 02/16/2020   ALT 8 (L) 02/16/2020   PROT 8.1 02/16/2020   ALBUMIN 3.8 11/17/2016   CALCIUM 9.1 02/16/2020   GFRAA 112 02/16/2020   QFTBGOLDPLUS NEGATIVE 04/11/2019    Speciality Comments: No specialty comments available.  Procedures:  No procedures performed Allergies: Patient has no known allergies.   Assessment / Plan:     Visit Diagnoses: No diagnosis found.  Orders: No orders of  the defined types were placed in this encounter.  No orders of the defined types were placed in this encounter.   Face-to-face time spent with patient was *** minutes. Greater than 50% of time was spent in counseling and coordination of care.  Follow-Up Instructions: No follow-ups on file.   Earnestine Mealing, CMA  Note - This record has been created using Radio producer.  Chart creation errors have been sought, but may not always  have been located. Such creation errors do not reflect on  the standard of medical care.

## 2020-10-09 ENCOUNTER — Ambulatory Visit: Payer: Medicare Other | Admitting: Rheumatology

## 2020-10-09 DIAGNOSIS — M1A09X0 Idiopathic chronic gout, multiple sites, without tophus (tophi): Secondary | ICD-10-CM

## 2020-10-09 DIAGNOSIS — I10 Essential (primary) hypertension: Secondary | ICD-10-CM

## 2020-10-09 DIAGNOSIS — M170 Bilateral primary osteoarthritis of knee: Secondary | ICD-10-CM

## 2020-10-09 DIAGNOSIS — M0609 Rheumatoid arthritis without rheumatoid factor, multiple sites: Secondary | ICD-10-CM

## 2020-10-09 DIAGNOSIS — M7540 Impingement syndrome of unspecified shoulder: Secondary | ICD-10-CM

## 2020-10-09 DIAGNOSIS — R5383 Other fatigue: Secondary | ICD-10-CM

## 2020-10-09 DIAGNOSIS — Z79899 Other long term (current) drug therapy: Secondary | ICD-10-CM

## 2020-10-09 DIAGNOSIS — J869 Pyothorax without fistula: Secondary | ICD-10-CM

## 2020-10-09 DIAGNOSIS — M5030 Other cervical disc degeneration, unspecified cervical region: Secondary | ICD-10-CM

## 2020-10-09 DIAGNOSIS — I482 Chronic atrial fibrillation, unspecified: Secondary | ICD-10-CM

## 2021-03-26 NOTE — Progress Notes (Deleted)
Office Visit Note  Patient: Billy Hurley             Date of Birth: 12/14/1935           MRN: [id number]             PCP: Christena Flake, MD Referring: Christena Flake, MD Visit Date: 04/09/2021 Occupation: @GUAROCC @  Subjective:  No chief complaint on file.   History of Present Illness: Billy Hurley is a 85 y.o. male ***   Activities of Daily Living:  Patient reports morning stiffness for *** {minute/hour:19697}.   Patient {ACTIONS;DENIES/REPORTS:21021675::"Denies"} nocturnal pain.  Difficulty dressing/grooming: {ACTIONS;DENIES/REPORTS:21021675::"Denies"} Difficulty climbing stairs: {ACTIONS;DENIES/REPORTS:21021675::"Denies"} Difficulty getting out of chair: {ACTIONS;DENIES/REPORTS:21021675::"Denies"} Difficulty using hands for taps, buttons, cutlery, and/or writing: {ACTIONS;DENIES/REPORTS:21021675::"Denies"}  No Rheumatology ROS completed.   PMFS History:  Patient Active Problem List   Diagnosis Date Noted   Rheumatoid arthritis of multiple sites with negative rheumatoid factor (HCC) 10/28/2016   High risk medication use 10/28/2016   Idiopathic chronic gout of multiple sites without tophus 09/17/2016   Bilateral knee effusions 09/17/2016   History of gout 08/26/2016   Primary osteoarthritis of both knees 08/26/2016   Impingement syndrome of shoulder region 08/26/2016   Essential hypertension 08/26/2016   History of hyperglycemia 08/26/2016   Chronic atrial fibrillation (HCC) 08/26/2016   Other sleep apnea 08/26/2016    Past Medical History:  Diagnosis Date   A-fib (HCC)    Gout    Hypertension     Family History  Problem Relation Age of Onset   Stroke Brother    Hypertension Brother    Hypertension Brother    Past Surgical History:  Procedure Laterality Date   APPENDECTOMY     LUNG SURGERY  07/2019   fluid removal   TOTAL SHOULDER ARTHROPLASTY     Social History   Social History Narrative   Not on file   Immunization History  Administered  Date(s) Administered   DT (Pediatric) 07/11/2008   Influenza, High Dose Seasonal PF 06/29/2017, 05/12/2018   PFIZER(Purple Top)SARS-COV-2 Vaccination 08/23/2019, 09/12/2019   Pneumococcal Conjugate-13 08/03/2015   Pneumococcal-Unspecified 08/07/2003   Tetanus 07/11/2008     Objective: Vital Signs: There were no vitals taken for this visit.   Physical Exam   Musculoskeletal Exam: ***  CDAI Exam: CDAI Score: -- Patient Global: --; Provider Global: -- Swollen: --; Tender: -- Joint Exam 04/09/2021   No joint exam has been documented for this visit   There is currently no information documented on the homunculus. Go to the Rheumatology activity and complete the homunculus joint exam.  Investigation: No additional findings.  Imaging: No results found.  Recent Labs: Lab Results  Component Value Date   WBC 7.5 02/16/2020   HGB 10.0 (L) 02/16/2020   PLT 421 (H) 02/16/2020   NA 136 02/16/2020   K 4.2 02/16/2020   CL 98 02/16/2020   CO2 27 02/16/2020   GLUCOSE 112 (H) 02/16/2020   BUN 12 02/16/2020   CREATININE 0.55 (L) 02/16/2020   BILITOT 0.7 02/16/2020   ALKPHOS 84 11/17/2016   AST 15 02/16/2020   ALT 8 (L) 02/16/2020   PROT 8.1 02/16/2020   ALBUMIN 3.8 11/17/2016   CALCIUM 9.1 02/16/2020   GFRAA 112 02/16/2020   QFTBGOLDPLUS NEGATIVE 04/11/2019    Speciality Comments: No specialty comments available.  Procedures:  No procedures performed Allergies: Patient has no known allergies.   Assessment / Plan:     Visit Diagnoses: No diagnosis found.  Orders: No orders of  the defined types were placed in this encounter.  No orders of the defined types were placed in this encounter.   Face-to-face time spent with patient was *** minutes. Greater than 50% of time was spent in counseling and coordination of care.  Follow-Up Instructions: No follow-ups on file.   Ellen Henri, CMA  Note - This record has been created using Animal nutritionist.  Chart creation  errors have been sought, but may not always  have been located. Such creation errors do not reflect on  the standard of medical care.

## 2021-04-04 ENCOUNTER — Telehealth: Payer: Self-pay

## 2021-04-04 NOTE — Telephone Encounter (Signed)
Steward Drone called to cancel [facility] appointment on 04/09/21.  She states he is currently at a nursing facility in [place] recovering from 3 separate surgeries.  She states she is not sure when he will be able to reschedule.

## 2021-04-09 ENCOUNTER — Ambulatory Visit: Payer: Medicare Other | Admitting: Rheumatology

## 2021-04-09 DIAGNOSIS — M7540 Impingement syndrome of unspecified shoulder: Secondary | ICD-10-CM

## 2021-04-09 DIAGNOSIS — R5383 Other fatigue: Secondary | ICD-10-CM

## 2021-04-09 DIAGNOSIS — J869 Pyothorax without fistula: Secondary | ICD-10-CM

## 2021-04-09 DIAGNOSIS — M1A09X0 Idiopathic chronic gout, multiple sites, without tophus (tophi): Secondary | ICD-10-CM

## 2021-04-09 DIAGNOSIS — M5030 Other cervical disc degeneration, unspecified cervical region: Secondary | ICD-10-CM

## 2021-04-09 DIAGNOSIS — Z79899 Other long term (current) drug therapy: Secondary | ICD-10-CM

## 2021-04-09 DIAGNOSIS — I10 Essential (primary) hypertension: Secondary | ICD-10-CM

## 2021-04-09 DIAGNOSIS — M170 Bilateral primary osteoarthritis of knee: Secondary | ICD-10-CM

## 2021-04-09 DIAGNOSIS — M0609 Rheumatoid arthritis without rheumatoid factor, multiple sites: Secondary | ICD-10-CM

## 2021-04-09 DIAGNOSIS — I482 Chronic atrial fibrillation, unspecified: Secondary | ICD-10-CM

## 2021-08-12 ENCOUNTER — Other Ambulatory Visit: Payer: Self-pay | Admitting: Rheumatology
